# Patient Record
Sex: Male | Born: 1990
Health system: Southern US, Community
[De-identification: ages and names within clinical notes are randomized; demographics above are authoritative.]

## PROBLEM LIST (undated history)

## (undated) ENCOUNTER — Emergency Department (HOSPITAL_COMMUNITY): Admission: EM | Payer: Self-pay | Source: Home / Self Care

## (undated) DIAGNOSIS — M419 Scoliosis, unspecified: Secondary | ICD-10-CM

## (undated) DIAGNOSIS — R51 Headache: Secondary | ICD-10-CM

## (undated) DIAGNOSIS — J329 Chronic sinusitis, unspecified: Secondary | ICD-10-CM

## (undated) DIAGNOSIS — T7840XA Allergy, unspecified, initial encounter: Secondary | ICD-10-CM

## (undated) DIAGNOSIS — R040 Epistaxis: Secondary | ICD-10-CM

## (undated) DIAGNOSIS — J339 Nasal polyp, unspecified: Secondary | ICD-10-CM

## (undated) DIAGNOSIS — R519 Headache, unspecified: Secondary | ICD-10-CM

## (undated) HISTORY — DX: Allergy, unspecified, initial encounter: T78.40XA

---
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
## 2008-09-23 ENCOUNTER — Emergency Department (HOSPITAL_COMMUNITY): Admission: EM | Admit: 2008-09-23 | Discharge: 2008-09-23 | Payer: Self-pay | Admitting: Emergency Medicine

## 2008-12-22 ENCOUNTER — Emergency Department (HOSPITAL_COMMUNITY): Admission: EM | Admit: 2008-12-22 | Discharge: 2008-12-22 | Payer: Self-pay | Admitting: Emergency Medicine

## 2009-01-07 ENCOUNTER — Emergency Department (HOSPITAL_COMMUNITY): Admission: EM | Admit: 2009-01-07 | Discharge: 2009-01-07 | Payer: Self-pay | Admitting: Emergency Medicine

## 2009-03-17 ENCOUNTER — Emergency Department (HOSPITAL_COMMUNITY): Admission: EM | Admit: 2009-03-17 | Discharge: 2009-03-17 | Payer: Self-pay | Admitting: Emergency Medicine

## 2009-05-09 ENCOUNTER — Emergency Department (HOSPITAL_COMMUNITY): Admission: EM | Admit: 2009-05-09 | Discharge: 2009-05-09 | Payer: Self-pay | Admitting: Emergency Medicine

## 2009-05-29 ENCOUNTER — Emergency Department (HOSPITAL_COMMUNITY): Admission: EM | Admit: 2009-05-29 | Discharge: 2009-05-29 | Payer: Self-pay | Admitting: Emergency Medicine

## 2009-06-11 ENCOUNTER — Emergency Department (HOSPITAL_COMMUNITY): Admission: EM | Admit: 2009-06-11 | Discharge: 2009-06-12 | Payer: Self-pay | Admitting: Emergency Medicine

## 2009-07-18 ENCOUNTER — Emergency Department (HOSPITAL_COMMUNITY): Admission: EM | Admit: 2009-07-18 | Discharge: 2009-07-18 | Payer: Self-pay | Admitting: Emergency Medicine

## 2009-08-04 ENCOUNTER — Emergency Department (HOSPITAL_COMMUNITY): Admission: EM | Admit: 2009-08-04 | Discharge: 2009-08-04 | Payer: Self-pay | Admitting: Emergency Medicine

## 2009-09-07 ENCOUNTER — Emergency Department (HOSPITAL_COMMUNITY): Admission: EM | Admit: 2009-09-07 | Discharge: 2009-09-07 | Payer: Self-pay | Admitting: Emergency Medicine

## 2009-10-07 ENCOUNTER — Emergency Department (HOSPITAL_COMMUNITY): Admission: EM | Admit: 2009-10-07 | Discharge: 2009-10-08 | Payer: Self-pay | Admitting: Emergency Medicine

## 2009-10-11 ENCOUNTER — Emergency Department (HOSPITAL_COMMUNITY): Admission: EM | Admit: 2009-10-11 | Discharge: 2009-10-11 | Payer: Self-pay | Admitting: Emergency Medicine

## 2009-11-11 ENCOUNTER — Emergency Department (HOSPITAL_COMMUNITY): Admission: EM | Admit: 2009-11-11 | Discharge: 2009-11-11 | Payer: Self-pay | Admitting: Emergency Medicine

## 2010-01-11 ENCOUNTER — Emergency Department (HOSPITAL_COMMUNITY): Admission: EM | Admit: 2010-01-11 | Discharge: 2010-01-12 | Payer: Self-pay | Admitting: Emergency Medicine

## 2010-08-22 LAB — POCT I-STAT, CHEM 8
BUN: 6 mg/dL (ref 6–23)
Calcium, Ion: 1.09 mmol/L — ABNORMAL LOW (ref 1.12–1.32)
Chloride: 104 mEq/L (ref 96–112)
Glucose, Bld: 98 mg/dL (ref 70–99)
Hemoglobin: 15.6 g/dL (ref 13.0–17.0)
Potassium: 3.5 mEq/L (ref 3.5–5.1)

## 2010-08-22 LAB — CBC
Hemoglobin: 14.3 g/dL (ref 13.0–17.0)
MCH: 31.2 pg (ref 26.0–34.0)
MCHC: 33.6 g/dL (ref 30.0–36.0)
Platelets: 219 10*3/uL (ref 150–400)
WBC: 9.6 10*3/uL (ref 4.0–10.5)

## 2010-08-22 LAB — DIFFERENTIAL
Basophils Relative: 1 % (ref 0–1)
Lymphocytes Relative: 26 % (ref 12–46)
Monocytes Relative: 6 % (ref 3–12)

## 2010-08-22 LAB — ETHANOL: Alcohol, Ethyl (B): 125 mg/dL — ABNORMAL HIGH (ref 0–10)

## 2010-08-26 LAB — POCT I-STAT 3, ART BLOOD GAS (G3+)
Acid-Base Excess: 4 mmol/L — ABNORMAL HIGH (ref 0.0–2.0)
Patient temperature: 98.7

## 2010-08-26 LAB — CULTURE, BLOOD (ROUTINE X 2): Culture: NO GROWTH

## 2010-08-26 LAB — COMPREHENSIVE METABOLIC PANEL
Alkaline Phosphatase: 54 U/L (ref 39–117)
BUN: 4 mg/dL — ABNORMAL LOW (ref 6–23)
Calcium: 8.7 mg/dL (ref 8.4–10.5)
Creatinine, Ser: 0.77 mg/dL (ref 0.4–1.5)
GFR calc non Af Amer: >60 mL/min (ref >60)
Glucose, Bld: 94 mg/dL (ref 70–99)
Potassium: 3.4 mEq/L — ABNORMAL LOW (ref 3.5–5.1)
Total Bilirubin: 0.7 mg/dL (ref 0.3–1.2)
Total Protein: 6.6 g/dL (ref 6.0–8.3)

## 2010-08-26 LAB — LACTIC ACID, PLASMA: Lactic Acid, Venous: 1 mmol/L (ref 0.5–2.2)

## 2010-08-26 LAB — URINALYSIS, ROUTINE W REFLEX MICROSCOPIC
Bilirubin Urine: NEGATIVE
Glucose, UA: NEGATIVE mg/dL
Specific Gravity, Urine: 1.013 (ref 1.005–1.030)
pH: 6.5 (ref 5.0–8.0)

## 2010-08-26 LAB — CBC
MCHC: 34.5 g/dL (ref 30.0–36.0)
RDW: 14.4 % (ref 11.5–15.5)

## 2010-08-26 LAB — RAPID URINE DRUG SCREEN, HOSP PERFORMED
Amphetamines: NOT DETECTED
Tetrahydrocannabinol: NOT DETECTED

## 2010-08-26 LAB — ACETAMINOPHEN LEVEL: Acetaminophen (Tylenol), Serum: 29.6 ug/mL (ref 10–30)

## 2010-08-26 LAB — DIFFERENTIAL: Basophils Absolute: 0.1 10*3/uL (ref 0.0–0.1)

## 2010-11-26 ENCOUNTER — Emergency Department (HOSPITAL_COMMUNITY): Payer: Self-pay

## 2010-11-26 ENCOUNTER — Emergency Department (HOSPITAL_COMMUNITY)
Admission: EM | Admit: 2010-11-26 | Discharge: 2010-11-27 | Disposition: A | Discharge status: Home / Self Care | Payer: Self-pay | Attending: Emergency Medicine | Admitting: Emergency Medicine

## 2010-11-26 DIAGNOSIS — R197 Diarrhea, unspecified: Secondary | ICD-10-CM | POA: Insufficient documentation

## 2010-11-26 DIAGNOSIS — R1031 Right lower quadrant pain: Secondary | ICD-10-CM | POA: Insufficient documentation

## 2010-11-26 DIAGNOSIS — R112 Nausea with vomiting, unspecified: Secondary | ICD-10-CM | POA: Insufficient documentation

## 2010-11-26 LAB — URINALYSIS, ROUTINE W REFLEX MICROSCOPIC
Bilirubin Urine: NEGATIVE
Hgb urine dipstick: NEGATIVE
Nitrite: NEGATIVE
Protein, ur: NEGATIVE mg/dL
Specific Gravity, Urine: 1.031 — ABNORMAL HIGH (ref 1.005–1.030)
Urobilinogen, UA: 1 mg/dL (ref 0.0–1.0)

## 2010-11-26 LAB — CBC
MCHC: 34 g/dL (ref 30.0–36.0)
MCV: 89.8 fL (ref 78.0–100.0)
Platelets: 222 10*3/uL (ref 150–400)
RDW: 13.4 % (ref 11.5–15.5)
WBC: 11.8 10*3/uL — ABNORMAL HIGH (ref 4.0–10.5)

## 2010-11-26 LAB — DIFFERENTIAL
Basophils Absolute: 0.1 10*3/uL (ref 0.0–0.1)
Eosinophils Absolute: 0.6 10*3/uL (ref 0.0–0.7)
Eosinophils Relative: 5 % (ref 0–5)
Monocytes Absolute: 0.9 10*3/uL (ref 0.1–1.0)

## 2010-11-26 LAB — POCT I-STAT, CHEM 8
Calcium, Ion: 1.23 mmol/L (ref 1.12–1.32)
Creatinine, Ser: 1 mg/dL (ref 0.50–1.35)
Glucose, Bld: 107 mg/dL — ABNORMAL HIGH (ref 70–99)
HCT: 48 % (ref 39.0–52.0)
Hemoglobin: 16.3 g/dL (ref 13.0–17.0)

## 2010-11-27 MED ORDER — IOHEXOL 300 MG/ML  SOLN
100.0000 mL | Freq: Once | INTRAMUSCULAR | Status: AC | PRN
  Administered 2010-11-27: 100 mL via INTRAVENOUS

## 2011-01-09 IMAGING — CR DG ELBOW COMPLETE 3+V*L*
4 series · 4 of 4 positions shown · non-contrast
Comparison: None.

CLINICAL DATA: Wall, altered mental status

LEFT ELBOW - COMPLETE 3+ VIEW

[x elbow joint ap left]
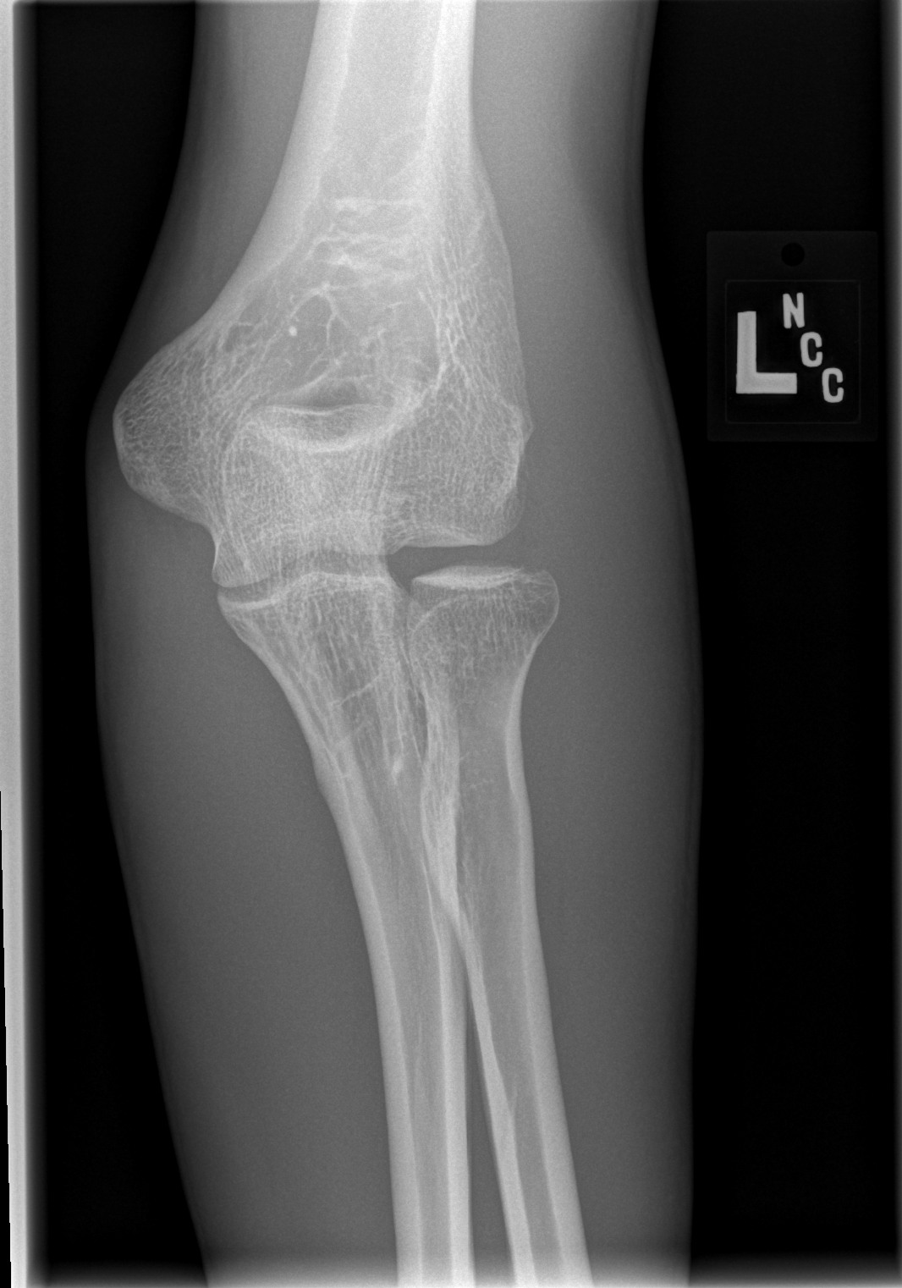

[x elbow joint obl. left (1 of 2)]
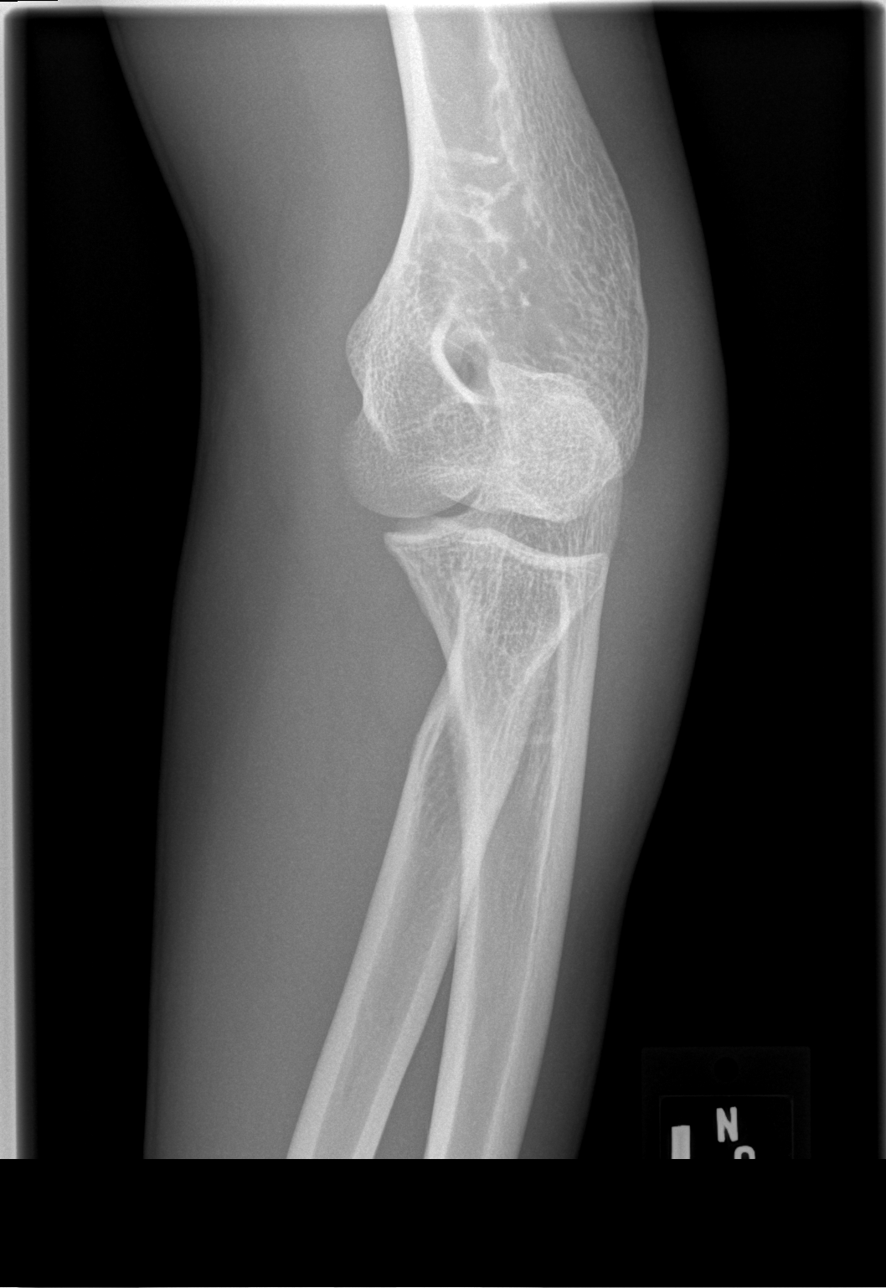

[x elbow joint obl. left (2 of 2)]
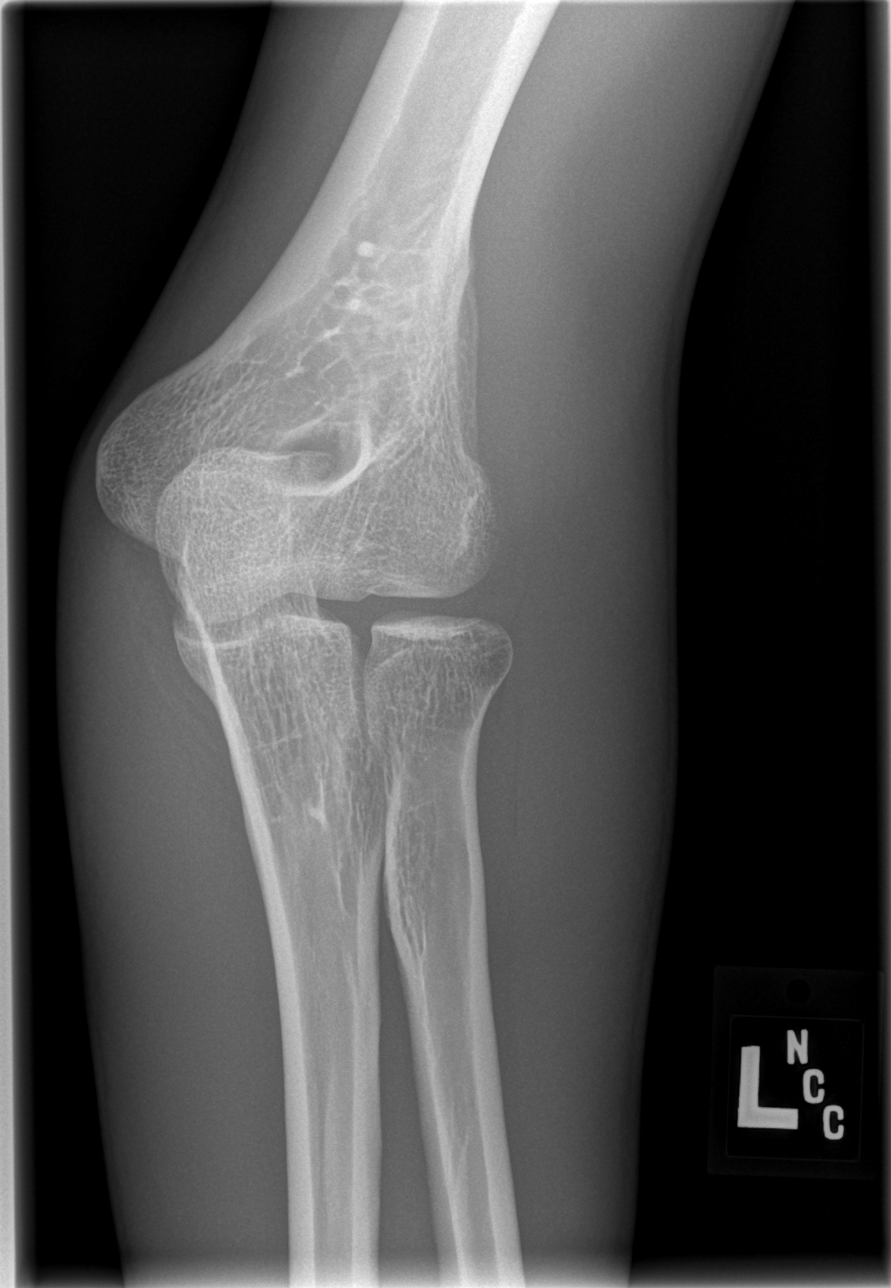

[x elbow joint lat left]
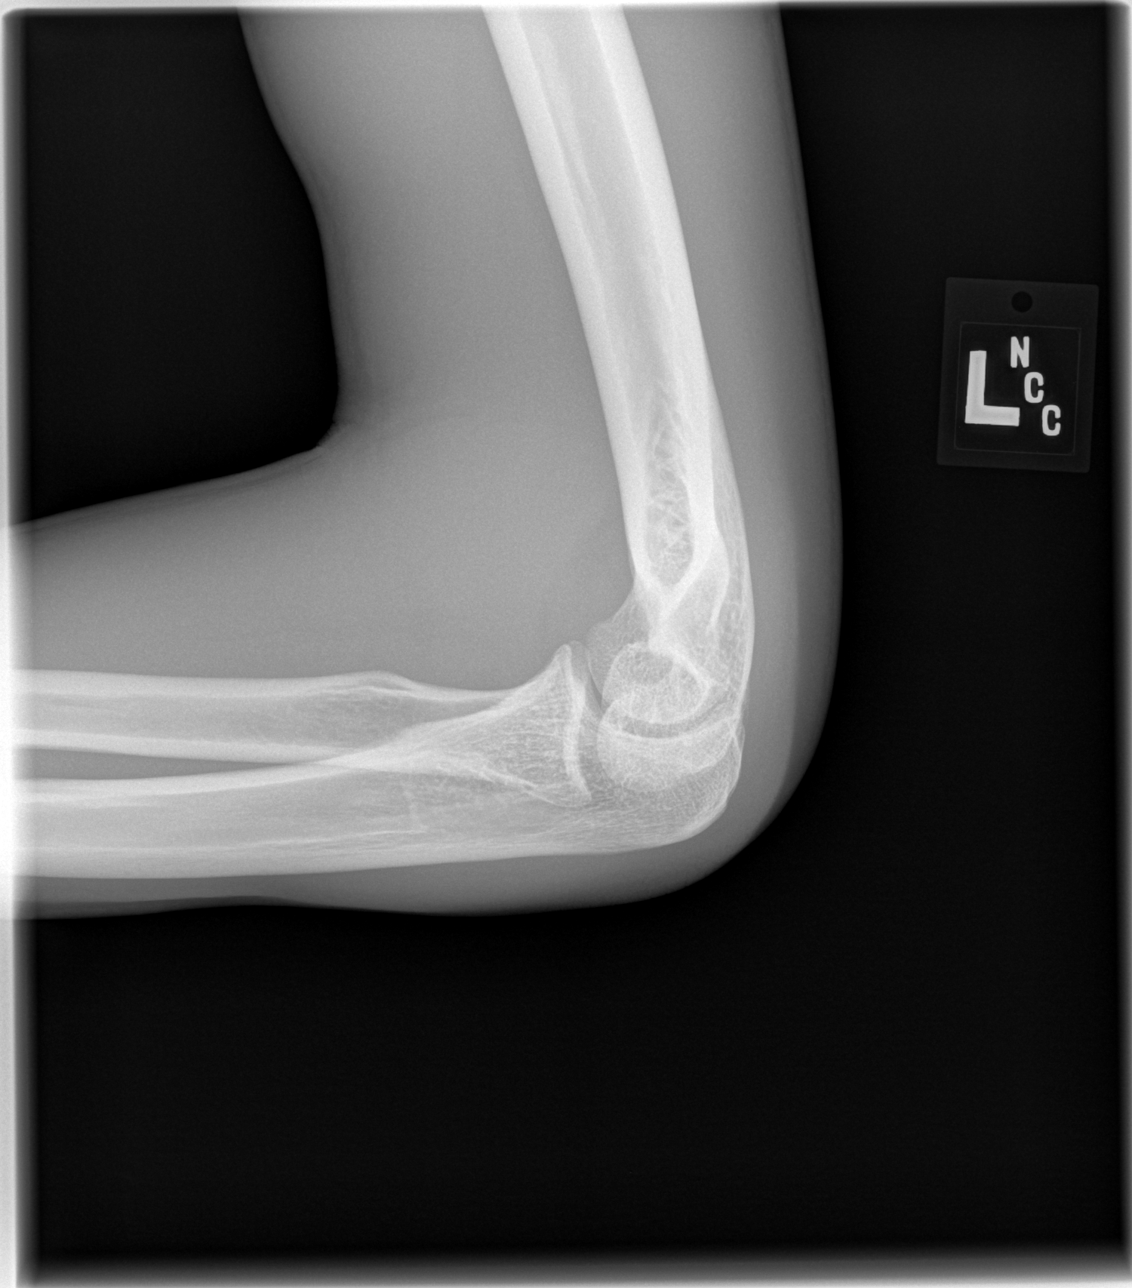

[4 of 4 positions shown; findings below may reference images not displayed]

FINDINGS: There is a left oblique joint space effusion present with
displacement of anterior and posterior fat pads.  There is slight
irregularity of the radial head, and I suspect a subtle left radial
head fracture.  Alignment is normal.
IMPRESSION: Left elbow joint space effusion.  Suspect subtle left radial head
fracture.

## 2011-01-09 IMAGING — CT CT HEAD W/O CM
5 of 6 series · 17 of 37 positions shown, 19 images · non-contrast
Comparison: None.

CT HEAD

CLINICAL DATA: Fall down steps.  Altered level of consciousness.
The patient is amnestic to the event.

CT HEAD WITHOUT CONTRAST
CT CERVICAL SPINE WITHOUT CONTRAST
TECHNIQUE: Multidetector CT imaging of the head and cervical spine
was performed following the standard protocol without intravenous
contrast.  Multiplanar CT image reconstructions of the cervical
spine were also generated.

[Series 2: brain · axial · 0.49mm/px · z∈[+295,+377]mm · 3 of 32 slices shown]
[im 8/32  brain]
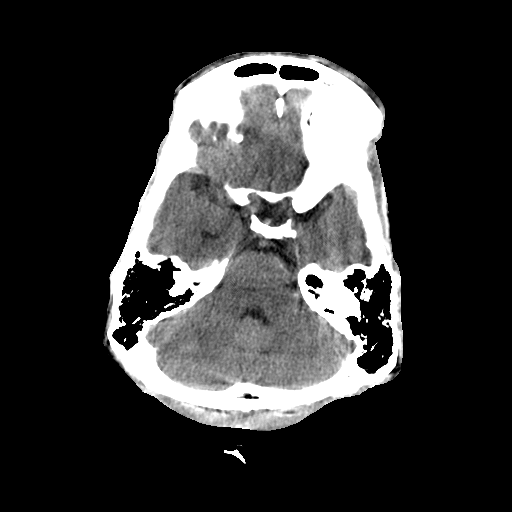
[im 16/32  brain]
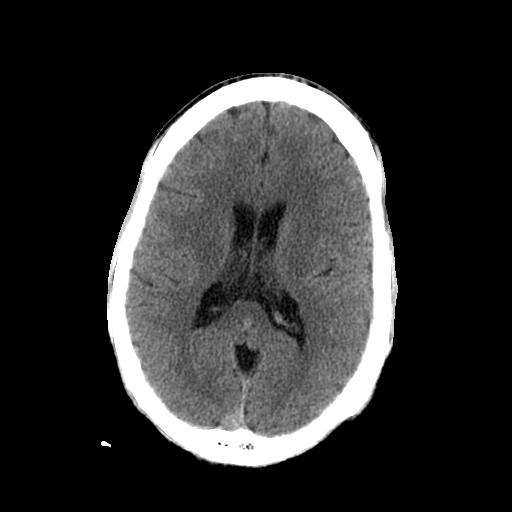
[im 24/32  brain]
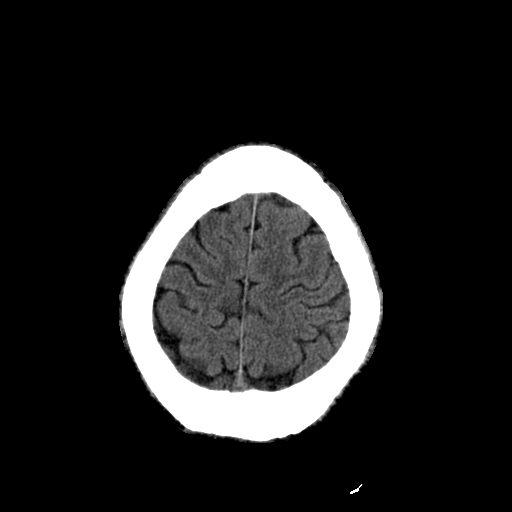

[Series 3: recon 2: brain · axial · 0.49mm/px · z∈[+284,+391]mm · 5 of 64 slices shown, 7 images]
[im 11/64  brain]
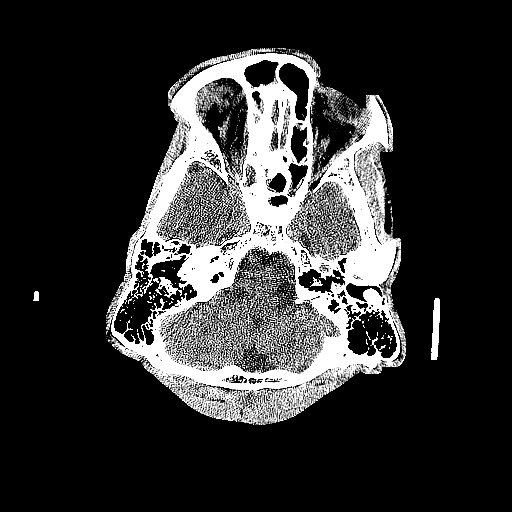
[im 11/64  bone]
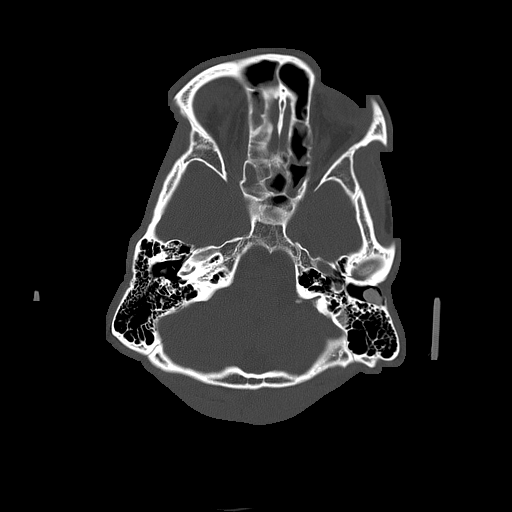
[im 22/64  brain]
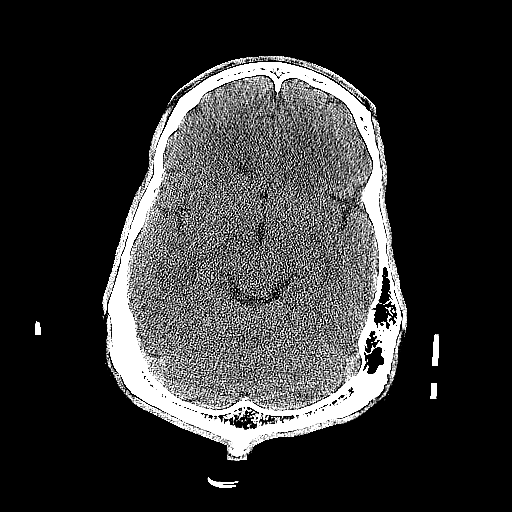
[im 32/64  brain]
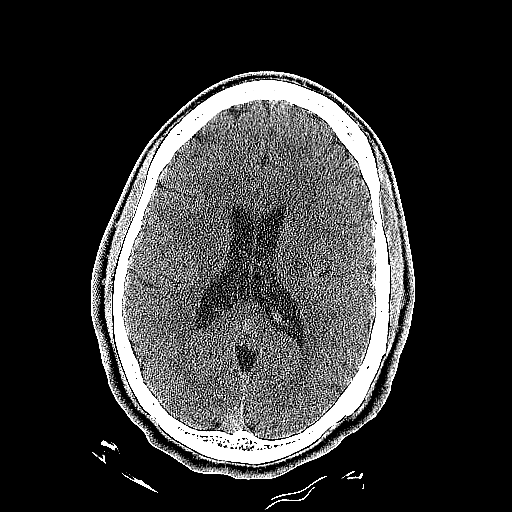
[im 43/64  brain]
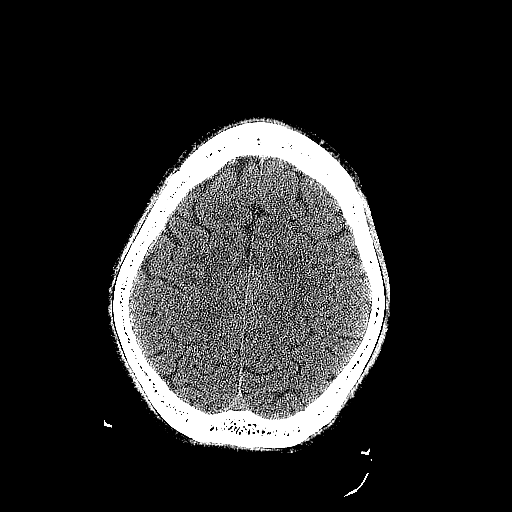
[im 53/64  brain]
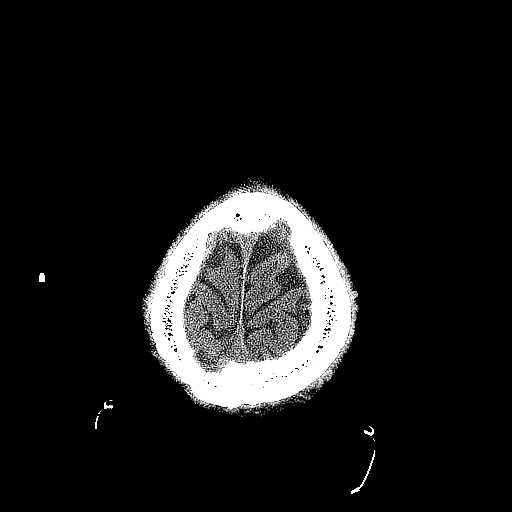
[im 53/64  bone]
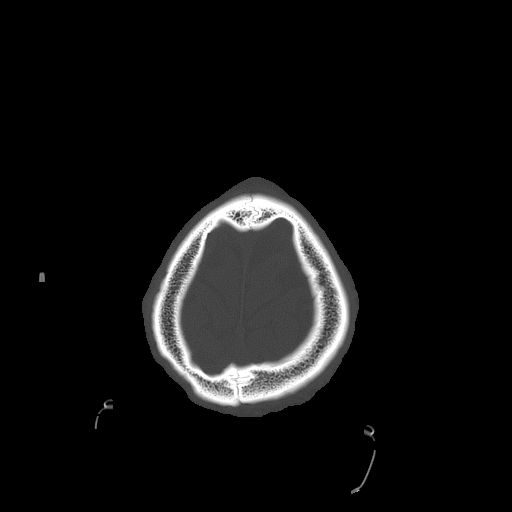

[Series 4: cervical spine · axial · 0.29mm/px · z∈[+72,+147]mm · 4 of 81 slices shown]
[im 11/81  brain]
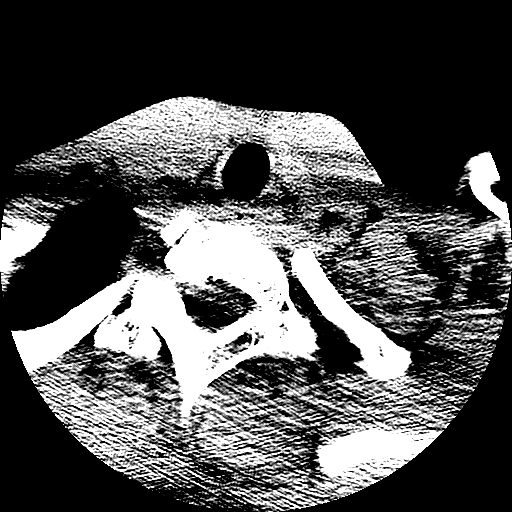
[im 21/81  brain]
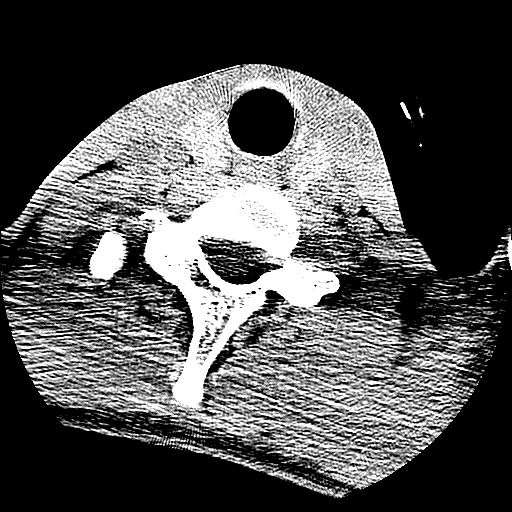
[im 31/81  brain]
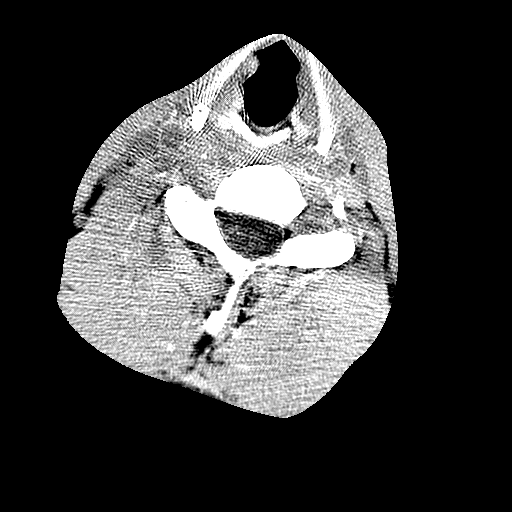
[im 41/81  brain]
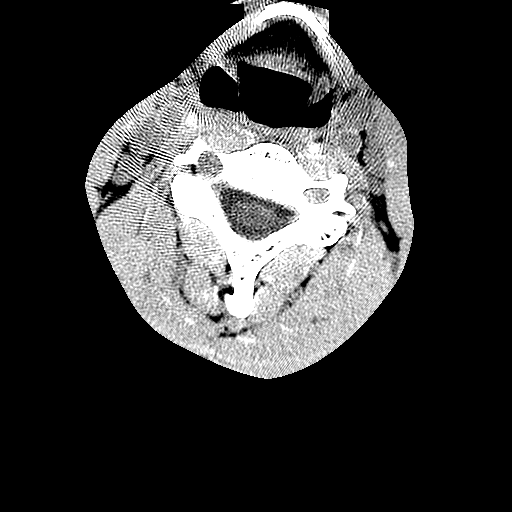

[Series 600: reformatted · sagittal · 0.40mm/px · 2 of 44 slices shown (1 of 2)]
[im 15/44  brain]
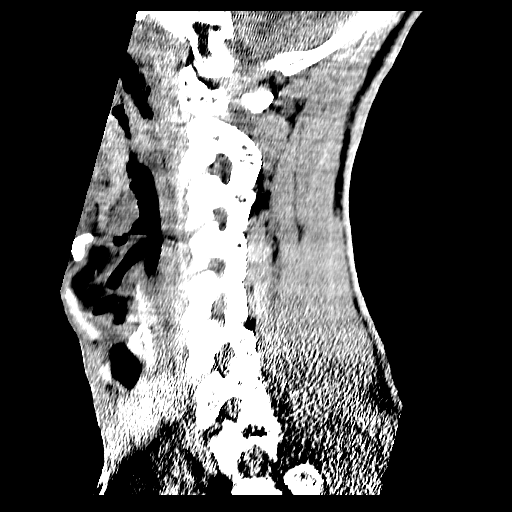
[im 29/44  brain]
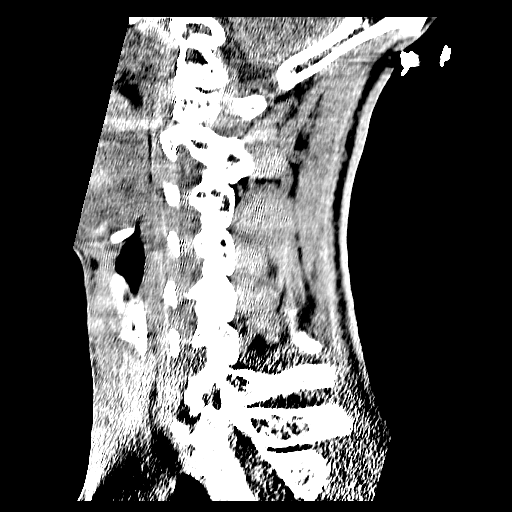

[Series 601: reformatted · coronal · 0.40mm/px · 3 of 44 slices shown (2 of 2)]
[im 2/44  brain]
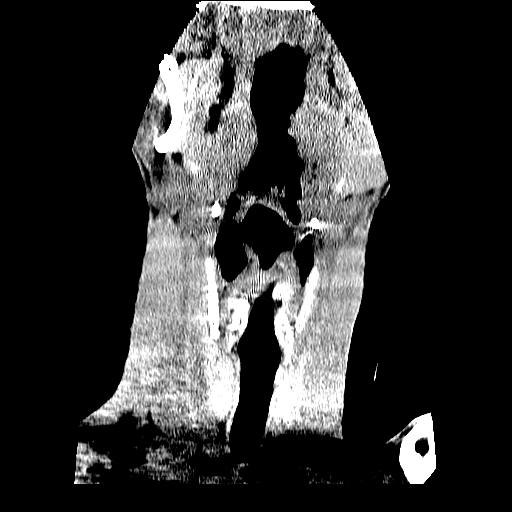
[im 16/44  brain]
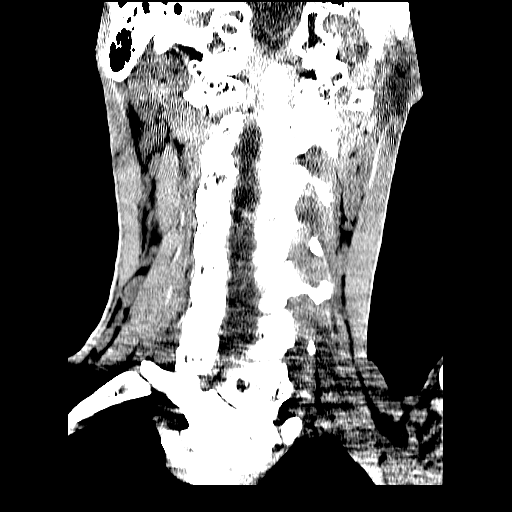
[im 30/44  brain]
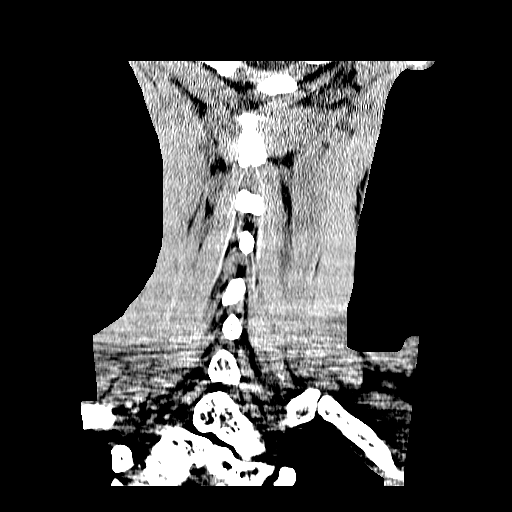

[17 of 37 positions shown; findings below may reference images not displayed]

FINDINGS: No acute cortical infarct, hemorrhage, mass,
hydrocephalus, or significant extra-axial fluid collection is
present.  There is extensive opacification of the ethmoid air cells
bilaterally, worse on the right.  The right sphenoid sinus and
sphenoidal recess are near totally opacified.  There is
circumferential mucosal thickening in the frontal sinuses
bilaterally, worse on the right.  The mastoid air cells are clear.
Debris in the external auditory canals bilaterally likely
represents cerumen.
IMPRESSION: 1.  No acute intracranial abnormality.
2.  Extensive sinus disease.

CT CERVICAL SPINE
FINDINGS: The cervical spine is visualized from skull base through
T1-2.  The vertebral body heights and alignment are maintained.  No
acute fracture or traumatic subluxation is evident.  The soft
tissues are unremarkable.  The lung apices are clear.
IMPRESSION: Negative CT of the cervical spine.

## 2011-02-27 LAB — INFLUENZA A+B VIRUS AG-DIRECT(RAPID): Inflenza A Ag: NEGATIVE

## 2011-09-18 ENCOUNTER — Emergency Department (HOSPITAL_COMMUNITY): Payer: Self-pay

## 2011-09-18 ENCOUNTER — Emergency Department (HOSPITAL_COMMUNITY)
Admission: EM | Admit: 2011-09-18 | Discharge: 2011-09-18 | Disposition: A | Discharge status: Home / Self Care | Payer: Self-pay | Attending: Emergency Medicine | Admitting: Emergency Medicine

## 2011-09-18 ENCOUNTER — Encounter (HOSPITAL_COMMUNITY): Payer: Self-pay | Admitting: *Deleted

## 2011-09-18 DIAGNOSIS — R05 Cough: Secondary | ICD-10-CM | POA: Insufficient documentation

## 2011-09-18 DIAGNOSIS — J3489 Other specified disorders of nose and nasal sinuses: Secondary | ICD-10-CM | POA: Insufficient documentation

## 2011-09-18 DIAGNOSIS — R0602 Shortness of breath: Secondary | ICD-10-CM | POA: Insufficient documentation

## 2011-09-18 DIAGNOSIS — R599 Enlarged lymph nodes, unspecified: Secondary | ICD-10-CM | POA: Insufficient documentation

## 2011-09-18 DIAGNOSIS — H6120 Impacted cerumen, unspecified ear: Secondary | ICD-10-CM | POA: Insufficient documentation

## 2011-09-18 DIAGNOSIS — J40 Bronchitis, not specified as acute or chronic: Secondary | ICD-10-CM | POA: Insufficient documentation

## 2011-09-18 DIAGNOSIS — R0789 Other chest pain: Secondary | ICD-10-CM | POA: Insufficient documentation

## 2011-09-18 DIAGNOSIS — R059 Cough, unspecified: Secondary | ICD-10-CM | POA: Insufficient documentation

## 2011-09-18 DIAGNOSIS — IMO0001 Reserved for inherently not codable concepts without codable children: Secondary | ICD-10-CM | POA: Insufficient documentation

## 2011-09-18 DIAGNOSIS — R062 Wheezing: Secondary | ICD-10-CM | POA: Insufficient documentation

## 2011-09-18 DIAGNOSIS — M2550 Pain in unspecified joint: Secondary | ICD-10-CM | POA: Insufficient documentation

## 2011-09-18 MED ORDER — ALBUTEROL SULFATE HFA 108 (90 BASE) MCG/ACT IN AERS
1.0000 | INHALATION_SPRAY | Freq: Once | RESPIRATORY_TRACT | Status: AC
  Administered 2011-09-18: 1 via RESPIRATORY_TRACT
  Filled 2011-09-18: qty 6.7

## 2011-09-18 MED ORDER — ALBUTEROL SULFATE (5 MG/ML) 0.5% IN NEBU
5.0000 mg | INHALATION_SOLUTION | Freq: Once | RESPIRATORY_TRACT | Status: AC
  Administered 2011-09-18: 5 mg via RESPIRATORY_TRACT
  Filled 2011-09-18: qty 1

## 2011-09-18 MED ORDER — FLUTICASONE PROPIONATE 50 MCG/ACT NA SUSP: NASAL | Status: DC

## 2011-09-18 MED ORDER — IBUPROFEN 800 MG PO TABS
800.0000 mg | ORAL_TABLET | Freq: Once | ORAL | Status: AC
  Administered 2011-09-18: 800 mg via ORAL
  Filled 2011-09-18: qty 1

## 2011-09-18 MED ORDER — IPRATROPIUM BROMIDE 0.02 % IN SOLN
0.5000 mg | Freq: Once | RESPIRATORY_TRACT | Status: AC
  Administered 2011-09-18: 0.5 mg via RESPIRATORY_TRACT
  Filled 2011-09-18: qty 2.5

## 2011-09-18 NOTE — ED Notes (Signed)
Pt reports 2-3 day hx of cough, sob, and night sweats. Unsure of fevers.

## 2011-09-18 NOTE — ED Provider Notes (Signed)
History     CSN: [id number]  Arrival date & time 09/18/11  1246   First MD Initiated Contact with Patient 09/18/11 1505      Chief Complaint  Patient presents with  . Cough  . Shortness of Breath  . Generalized Body Aches    (Consider location/radiation/quality/duration/timing/severity/associated sxs/prior treatment) HPI History provided by pt.   Pt has had a cough productive of yellowish-green sputum for the past three days.  Associated w/ tightness mid-line upper chest, SOB that is aggravated by exertion, a small amount of wheezing and arthralgias.  Has nasal congestion but this is chronic x approx 2 years.  Denies fever, sore throat, abd pain and vomiting, but has had diarrhea.  Pt has no h/o asthma or other cardiopulmonary disease.  Smokes cigarettes.   History reviewed. No pertinent past medical history.  History reviewed. No pertinent past surgical history.  History reviewed. No pertinent family history.  History  Substance Use Topics  . Smoking status: Current Everyday Smoker  . Smokeless tobacco: Not on file  . Alcohol Use: Yes     occ      Review of Systems  All other systems reviewed and are negative.    Allergies  Review of patient's allergies indicates no known allergies.  Home Medications  No current outpatient prescriptions on file.  BP 127/86  Pulse 106  Temp(Src) 98.6 F (37 C) (Oral)  Resp 16  SpO2 96%  Physical Exam  Nursing note and vitals reviewed. Constitutional: He is oriented to person, place, and time. He appears well-developed and well-nourished. No distress.  HENT:  Head: No trismus in the jaw.  Right Ear: Tympanic membrane, external ear and ear canal normal.  Left Ear: Tympanic membrane, external ear and ear canal normal.  Mouth/Throat: Uvula is midline, oropharynx is clear and moist and mucous membranes are normal. No oropharyngeal exudate, posterior oropharyngeal edema or posterior oropharyngeal erythema.       Impacted  cerumen bilaterally.  Nasal congestion.  Eyes:       Normal appearance  Neck: Normal range of motion. Neck supple.  Cardiovascular: Normal rate and regular rhythm.   Pulmonary/Chest: Effort normal.       Diffuse, mild exp wheezing.  Coughing  Lymphadenopathy:    He has cervical adenopathy.  Neurological: He is alert and oriented to person, place, and time.  Skin: Skin is warm and dry. No rash noted.  Psychiatric: He has a normal mood and affect. His behavior is normal.    ED Course  Procedures (including critical care time)  Labs Reviewed - No data to display Dg Chest 2 View  09/18/2011  *RADIOLOGY REPORT*  Clinical Data: Cough, shortness of breath.  CHEST - 2 VIEW  Comparison: 11/11/2009  Findings: Heart and mediastinal contours are within normal limits. No focal opacities or effusions.  No acute bony abnormality. Severe upper thoracic rightward scoliosis, stable.  IMPRESSION: No active cardiopulmonary disease.  Original Report Authenticated By: Cyndie Chime, M.D.     1. Bronchitis       MDM  Healthy 21yo M presents w/ productive cough, chest tightness, SOB and wheezing.  Diffuse expiratory wheezing on exam; no fever or respiratory distress.  CXR neg for pneumonia.  Will treat symptomatically for bronchitis.  Pt receiving a breathing treatment now.  Will reassess shortly and if wheezing improved, d/c home w/ an albuterol inhaler.  I recommended ibuprofen for pain, sudafed for nasal congestion and will prescribe him fluticasone as well since congestion is  chronic.  Will refer to [facility] as well.    Breath sounds have coarsened and pt no longer coughing on exam.  Pt reports feeling much better.  D/c'd home.        Arie Sabina Pitkin, [place] 09/18/11 [phone]

## 2011-09-18 NOTE — ED Provider Notes (Signed)
Medical screening examination/treatment/procedure(s) were performed by non-physician practitioner and as supervising physician I was immediately available for consultation/collaboration.   Omar Gayden L Gem Conkle, MD 09/18/11 1808 

## 2011-09-18 NOTE — Discharge Instructions (Signed)
Use albuterol inhaler, 2 puffs every 4 hours, as needed for cough and shortness of breath.  Take sudafed as needed for nasal congestion and try the daily fluticasone spray as well.  Take ibuprofen, up to 800mg  three times a day, or tylenol as needed for fever and body aches.  Call [facility] [phone]) if you do not have a primary care doctor and would like assistance with finding one.   You should return to the ER if your chest pain or shortness of breath worsens. Bronchitis Bronchitis is the body's way of reacting to injury and/or infection (inflammation) of the bronchi. Bronchi are the air tubes that extend from the windpipe into the lungs. If the inflammation becomes severe, it may cause shortness of breath. CAUSES  Inflammation may be caused by:  A virus.   Germs (bacteria).   Dust.   Allergens.   Pollutants and many other irritants.  The cells lining the bronchial tree are covered with tiny hairs (cilia). These constantly beat upward, away from the lungs, toward the mouth. This keeps the lungs free of pollutants. When these cells become too irritated and are unable to do their job, mucus begins to develop. This causes the characteristic cough of bronchitis. The cough clears the lungs when the cilia are unable to do their job. Without either of these protective mechanisms, the mucus would settle in the lungs. Then you would develop pneumonia. Smoking is a common cause of bronchitis and can contribute to pneumonia. Stopping this habit is the single most important thing you can do to help yourself. TREATMENT   Your caregiver may prescribe an antibiotic if the cough is caused by bacteria. Also, medicines that open up your airways make it easier to breathe. Your caregiver may also recommend or prescribe an expectorant. It will loosen the mucus to be coughed up. Only take over-the-counter or prescription medicines for pain, discomfort, or fever as directed by your caregiver.   Removing  whatever causes the problem (smoking, for example) is critical to preventing the problem from getting worse.   Cough suppressants may be prescribed for relief of cough symptoms.   Inhaled medicines may be prescribed to help with symptoms now and to help prevent problems from returning.   For those with recurrent (chronic) bronchitis, there may be a need for steroid medicines.  SEEK IMMEDIATE MEDICAL CARE IF:   During treatment, you develop more pus-like mucus (purulent sputum).   You have a fever.   Your baby is older than 3 months with a rectal temperature of 102 F (38.9 C) or higher.   Your baby is 31 months old or younger with a rectal temperature of 100.4 F (38 C) or higher.   You become progressively more ill.   You have increased difficulty breathing, wheezing, or shortness of breath.  It is necessary to seek immediate medical care if you are elderly or sick from any other disease. MAKE SURE YOU:   Understand these instructions.   Will watch your condition.   Will get help right away if you are not doing well or get worse.  Document Released: 05/25/2005 Document Revised: 05/14/2011 Document Reviewed: 04/03/2008 [facility] Patient Information 2012 [place], [place].

## 2011-09-18 NOTE — ED Notes (Signed)
Pt reports onset symptoms 2-3 days ago. Cough, fever and cold sweats at night. States no hx of asthma but is a smoker. States has not smoked while he has been sick. Also complains of body aches.

## 2011-09-25 ENCOUNTER — Emergency Department (HOSPITAL_COMMUNITY)
Admission: EM | Admit: 2011-09-25 | Discharge: 2011-09-25 | Disposition: A | Discharge status: Home / Self Care | Payer: Self-pay | Attending: Emergency Medicine | Admitting: Emergency Medicine

## 2011-09-25 ENCOUNTER — Encounter (HOSPITAL_COMMUNITY): Payer: Self-pay | Admitting: General Practice

## 2011-09-25 DIAGNOSIS — F17200 Nicotine dependence, unspecified, uncomplicated: Secondary | ICD-10-CM | POA: Insufficient documentation

## 2011-09-25 DIAGNOSIS — Z202 Contact with and (suspected) exposure to infections with a predominantly sexual mode of transmission: Secondary | ICD-10-CM | POA: Insufficient documentation

## 2011-09-25 LAB — URINALYSIS, ROUTINE W REFLEX MICROSCOPIC
Glucose, UA: NEGATIVE mg/dL
Ketones, ur: NEGATIVE mg/dL
Leukocytes, UA: NEGATIVE
Nitrite: NEGATIVE
Specific Gravity, Urine: 1.02 (ref 1.005–1.030)
pH: 6.5 (ref 5.0–8.0)

## 2011-09-25 MED ORDER — AZITHROMYCIN 1 G PO PACK
1.0000 g | PACK | Freq: Once | ORAL | Status: AC
  Administered 2011-09-25: 1 g via ORAL
  Filled 2011-09-25: qty 1

## 2011-09-25 MED ORDER — CEFTRIAXONE SODIUM 250 MG IJ SOLR
250.0000 mg | Freq: Once | INTRAMUSCULAR | Status: AC
  Administered 2011-09-25: 250 mg via INTRAMUSCULAR
  Filled 2011-09-25: qty 250

## 2011-09-25 NOTE — ED Provider Notes (Signed)
History     CSN: [id number]  Arrival date & time 09/25/11  1220   First MD Initiated Contact with Patient 09/25/11 1252      Chief Complaint  Patient presents with  . SEXUALLY TRANSMITTED DISEASE     HPI Pt was seen at 1305.  Per pt, c/o gradual onset and persistence of constant STD exposure for an unk period of time.  Pt states he had unprotected sex with a partner who called him and stated they "had chlamydia."  Pt states he is here to "get checked out" and "get treated."  Denies abd pain, no back pain, no fevers, no dysuria, no rash, no testicular pain/swelling, no penile drainage/rash.      Past Medical History  Diagnosis Date  . Bronchitis     History reviewed. No pertinent past surgical history.  History  Substance Use Topics  . Smoking status: Current Everyday Smoker -- 0.2 packs/day    Types: Cigarettes  . Smokeless tobacco: Never Used  . Alcohol Use: Yes     occ    Review of Systems ROS: Statement: All systems negative except as marked or noted in the HPI; Constitutional: Negative for fever and chills. ; ; Eyes: Negative for eye pain, redness and discharge. ; ; ENMT: Negative for ear pain, hoarseness, nasal congestion, sinus pressure and sore throat. ; ; Cardiovascular: Negative for chest pain, palpitations, diaphoresis, dyspnea and peripheral edema. ; ; Respiratory: Negative for cough, wheezing and stridor. ; ; Gastrointestinal: Negative for nausea, vomiting, diarrhea, abdominal pain, blood in stool, hematemesis, jaundice and rectal bleeding. . ; ; Genitourinary: Negative for dysuria, flank pain and hematuria.; Genital:  No penile drainage or rash, no testicular pain or swelling, no scrotal rash or swelling. ; Musculoskeletal: Negative for back pain and neck pain. Negative for swelling and trauma.; ; Skin: Negative for pruritus, rash, abrasions, blisters, bruising and skin lesion.; ; Neuro: Negative for headache, lightheadedness and neck stiffness. Negative for weakness,  altered level of consciousness , altered mental status, extremity weakness, paresthesias, involuntary movement, seizure and syncope.     Allergies  Review of patient's allergies indicates no known allergies.  Home Medications  No current outpatient prescriptions on file.  BP 112/83  Pulse 108  Temp(Src) 98.5 F (36.9 C) (Oral)  Resp 16  SpO2 100%  Physical Exam 1310: Physical examination:  Nursing notes reviewed; Vital signs and O2 SAT reviewed;  Constitutional: Well developed, Well nourished, Well hydrated, In no acute distress; Head:  Normocephalic, atraumatic; Eyes: EOMI, PERRL, No scleral icterus; ENMT: Mouth and pharynx normal, Mucous membranes moist; Neck: Supple, Full range of motion, No lymphadenopathy; Cardiovascular: Regular rate and rhythm, No murmur, rub, or gallop; Respiratory: Breath sounds clear & equal bilaterally, No rales, rhonchi, wheezes, or rub, Normal respiratory effort/excursion; Chest: Nontender, Movement normal; Abdomen: Soft, Nontender, Nondistended, Normal bowel sounds; Genitourinary: Genital performed with pt permission and male ED RN chaparone present during exam.  No perineal erythema.  No penile lesions or drainage.  No scrotal erythema, edema or tenderness to palp.  Normal testicular lie.  No testicular tenderness to palp.  +cremasteric reflexes bilat.  No inguinal LAN or palpable masses.  GC/chlamydia obtained and sent to lab; Extremities: Pulses normal, No tenderness, No edema, No calf edema or asymmetry.; Neuro: AA&Ox3, Major CN grossly intact.  No gross focal motor or sensory deficits in extremities.; Skin: Color normal, Warm, Dry, no rash.    ED Course  Procedures  MDM  MDM Reviewed: nursing note and vitals  Interpretation: labs   Results for orders placed during the hospital encounter of 09/25/11  URINALYSIS, ROUTINE W REFLEX MICROSCOPIC      Component Value Range   Color, Urine YELLOW  YELLOW    APPearance CLEAR  CLEAR    Specific Gravity,  Urine 1.020  1.005 - 1.030    pH 6.5  5.0 - 8.0    Glucose, UA NEGATIVE  NEGATIVE (mg/dL)   Hgb urine dipstick NEGATIVE  NEGATIVE    Bilirubin Urine NEGATIVE  NEGATIVE    Ketones, ur NEGATIVE  NEGATIVE (mg/dL)   Protein, ur NEGATIVE  NEGATIVE (mg/dL)   Urobilinogen, UA 1.0  0.0 - 1.0 (mg/dL)   Nitrite NEGATIVE  NEGATIVE    Leukocytes, UA NEGATIVE  NEGATIVE      2:18 PM:   GC/chlam obtained and sent to lab.  UA without UTI/UC pending.  Pt states he was exposed to "chlamydia;" will tx empirically.  Pt instructed to [facility] to [facility] to obtain further STD testing (herpes, HIV, etc), verb understanding.  Dx testing d/w pt.  Questions answered.  Verb understanding, agreeable to d/c home with outpt f/u.            Laray Anger, DO 09/25/11 2307

## 2011-09-25 NOTE — ED Notes (Signed)
Pt d/c home in NAD. No reaction noted after abx treatment. Pt voiced that he would follow up with the health dept.

## 2011-09-25 NOTE — ED Notes (Signed)
Pt states he would like to be tested for possible STDs. He admits to unprotected sex but denies any pain, discharge or presence of any sores. No pain when urinating or with ejaculation. Denies any changes in bowel or bladder habits. He does c/o unusual feeling in lower abdomen, he has a vague description, nausea or vomiting.

## 2011-09-25 NOTE — ED Notes (Signed)
Pt states "i just want to be tested for any type of STD." States he does have a sore/scratch on penis.

## 2011-09-25 NOTE — Discharge Instructions (Signed)
RESOURCE GUIDE  Dental Problems  Patients with Medicaid: [facility]                     [facility] [address]                                           [address] Phone:  [phone]                                                  Phone:  [phone]  If unable to pay or uninsured, contact:  [facility] or [facility] to become qualified for the adult dental clinic.  Chronic Pain Problems Contact [facility]  [phone] Patients need to be referred by their primary care doctor.  Insufficient Money for Medicine Contact [employer]:  call "211" or [facility] 271-5999.  No Primary Care Doctor Call [facility]  832-8000 Other agencies that provide inexpensive medical care    [facility]  [phone]    [facility]  [phone]    [facility]  [phone]    Women's Clinic  [phone]    [facility]  [phone]    [facility]  [phone]  Psychological Services [facility]  [phone] [facility]  [phone] [facility]   [phone] (emergency services 641-4993)  Substance Abuse Resources [facility]  336-882-2125 [facility] 336-784-9470 [facility] 336-285-9073 [facility] 336-845-3988 Residential & Outpatient Substance Abuse Program  800-659-3381  Abuse/Neglect [place] Child Abuse Hotline (336) 641-3795 [place] Child Abuse Hotline 800-378-5315 (After Hours)  Emergency Shelter [facility] (336) 271-5985  Maternity Homes [facility] (336) 275-9566 [facility] (704) 372-4663  MRSA Hotline #:   832-7006    [place] Resources  [facility] [place]     [employer]                          [facility] [address] [place]                       [address]      [address]  [place]                                                [place]                            [place] Phone:  [phone]                                   Phone:  [phone]                 Phone:  [phone]  [facility] Phone:  [phone]    [facility] Child Abuse Hotline (201)412-4568 7370381839 (After Hours)   Call your regular medical doctor today to schedule a follow up appointment within the next week for further STD testing.  Return to the Emergency Department immediately sooner if worsening.

## 2011-09-26 LAB — GC/CHLAMYDIA PROBE AMP, GENITAL: Chlamydia, DNA Probe: NEGATIVE

## 2011-09-26 LAB — URINE CULTURE
Colony Count: NO GROWTH
Culture  Setup Time: 201304191350

## 2013-12-27 ENCOUNTER — Encounter (HOSPITAL_COMMUNITY): Payer: Self-pay | Admitting: Emergency Medicine

## 2013-12-27 ENCOUNTER — Emergency Department (HOSPITAL_COMMUNITY)
Admission: EM | Admit: 2013-12-27 | Discharge: 2013-12-27 | Disposition: A | Discharge status: Home / Self Care | Payer: Self-pay | Attending: Emergency Medicine | Admitting: Emergency Medicine

## 2013-12-27 DIAGNOSIS — Z202 Contact with and (suspected) exposure to infections with a predominantly sexual mode of transmission: Secondary | ICD-10-CM | POA: Insufficient documentation

## 2013-12-27 DIAGNOSIS — Z8709 Personal history of other diseases of the respiratory system: Secondary | ICD-10-CM | POA: Insufficient documentation

## 2013-12-27 DIAGNOSIS — F17200 Nicotine dependence, unspecified, uncomplicated: Secondary | ICD-10-CM | POA: Insufficient documentation

## 2013-12-27 LAB — RPR

## 2013-12-27 LAB — HIV ANTIBODY (ROUTINE TESTING W REFLEX): HIV 1&2 Ab, 4th Generation: NONREACTIVE

## 2013-12-27 MED ORDER — AZITHROMYCIN 250 MG PO TABS
1000.0000 mg | ORAL_TABLET | Freq: Once | ORAL | Status: AC
  Administered 2013-12-27: 1000 mg via ORAL
  Filled 2013-12-27: qty 4

## 2013-12-27 MED ORDER — CEFTRIAXONE SODIUM 250 MG IJ SOLR
250.0000 mg | Freq: Once | INTRAMUSCULAR | Status: AC
  Administered 2013-12-27: 250 mg via INTRAMUSCULAR
  Filled 2013-12-27: qty 250

## 2013-12-27 NOTE — Discharge Instructions (Signed)
You have been treated in the emergency department for an infection, possibly sexually transmitted. Results of your gonorrhea and chlamydia tests are pending and you will be notified if they are positive. It is very important to practice safe sex and use condoms with every sexual encounter. If your results are positive you need to notify all sexual partners so they can be treated as well. The website [email or web address].net/ can be used to send anonymous text messages or emails to alert sexual contacts. Follow up with your doctor, or OBGYN in regards to today's visit.    Gonorrhea and Chlamydia SYMPTOMS  In females, symptoms may go unnoticed. Symptoms that are more noticeable can include:  Belly (abdominal) pain.  Painful intercourse.  Watery mucous-like discharge from the vagina.  Miscarriage.  Discomfort when urinating.  Inflammation of the rectum.  Abnormal gray-green frothy vaginal discharge  Vaginal itching and irritatio  Itching and irritation of the area outside the vagina.   Painful urination.  Bleeding after sexual intercourse.  In males, symptoms include:  Burning with urination.  Pain in the testicles.  Watery mucous-like discharge from the penis.  It can cause longstanding (chronic) pelvic pain after frequent infections.  TREATMENT  PID can cause women to not be able to have children (sterile) if left untreated or if half-treated.  It is important to finish ALL medications given to you.  This is a sexually transmitted infection. So you are also at risk for other sexually transmitted diseases, including HIV (AIDS), it is recommended that you get tested. HOME CARE INSTRUCTIONS  Warning: This infection is contagious. Do not have sex until treatment is completed. Follow up at your caregiver's office or the clinic to which you were referred. If your diagnosis (learning what is wrong) is confirmed by culture or some other method, your recent sexual contacts need treatment. Even if  they are symptom free or have a negative culture or evaluation, they should be treated.  PREVENTION  Women should use sanitary pads instead of tampons for vaginal discharge.  Wipe front to back after using the toilet and avoid douching.   Practice safe sex, use condoms, have only one sex partner and be sure your sex partner is not having sex with others.  Ask your caregiver to test you for chlamydia at your regular checkups or sooner if you are having symptoms.  Ask for further information if you are pregnant.  SEEK IMMEDIATE MEDICAL CARE IF:  You develop an oral temperature above 102 F (38.9 C), not controlled by medications or lasting more than 2 days.  You develop an increase in pain.  You develop any type of abnormal discharge.  You develop vaginal bleeding and it is not time for your period.  You develop painful intercourse.   Bacterial Vaginosis  Bacterial vaginosis (BV) is a vaginal infection where the normal balance of bacteria in the vagina is disrupted. This is not a sexually transmitted disease and your sexual partners do NOT need to be treated. CAUSES  The cause of BV is not fully understood. BV develops when there is an increase or imbalance of harmful bacteria.  Some activities or behaviors can upset the normal balance of bacteria in the vagina and put women at increased risk including:  Having a new sex partner or multiple sex partners.  Douching.  Using an intrauterine device (IUD) for contraception.  It is not clear what role sexual activity plays in the development of BV. However, women that have never had sexual intercourse are  rarely infected with BV.  Women do not get BV from toilet seats, bedding, swimming pools or from touching objects around them.   SYMPTOMS  Grey vaginal discharge.  A fish-like odor with discharge, especially after sexual intercourse.  Itching or burning of the vagina and vulva.  Burning or pain with urination.  Some women have no signs or  symptoms at all.   TREATMENT  Sometimes BV will clear up without treatment.  BV may be treated with antibiotics.  BV can recur after treatment. If this happens, a second round of antibiotics will often be prescribed.  HOME CARE INSTRUCTIONS  Finish all medication as directed by your caregiver.  Do not have sex until treatment is completed.  Do NOT drink any alcoholic beverages while being treated  with Metronidazole (Flagyl). This will cause a severe reaction inducing vomiting.  RESOURCE GUIDE  Dental Problems  Patients with Medicaid: [facility]                     [facility] [phone]                                           [phone] Phone:  [phone]                                                  Phone:  [phone]  If unable to pay or uninsured, contact:  [facility] or [facility] to become qualified for the adult dental clinic.  Chronic Pain Problems Contact [facility]  [phone] Patients need to be referred by their primary care doctor.  Insufficient Money for Medicine Contact [employer] Way:  call "211" or [facility] 450 [phone].  No Primary Care Doctor Call [facility]  [phone] Other agencies that provide inexpensive medical care    [facility]  [phone]    [facility]  [phone]    [facility]  [phone]    [facility]  [phone]    [facility]  [phone]    [facility]  [phone]  [facility]ological Services [facility]  [phone] [facility]  [phone] [facility]   [phone] (emergency services [phone])  Substance Abuse Resources [facility]  [phone] [facility] [phone] [facility] [phone] [facility] [phone] Residential & Outpatient Substance Abuse Program  [phone]  Abuse/Neglect [facility] Hotline  [phone] [facility] Hot[facility] [phone] (After Hours)  [facility] [phone]([phone]  [facility] [place] [facility] [phone] [facility] [phone]  MRSA Hotline #:   870-882-6653838-823-1449    [facility]  [facility]     [employer]                          [facility] [address] [place]                       [address]      [address]  [address]                                                Cristobal Goldmann Phone:  [phone]                                   Phone:  [phone]                 Phone:  [phone]  [facility] Phone:  [phone]  [facility] Child Abuse Hotline [phone] [phone] (After Hours)

## 2013-12-27 NOTE — ED Provider Notes (Signed)
CSN: [id number]     Arrival date & time 12/27/13  1131 History  This chart was scribed for non-physician practitioner, Clabe Seal, PA-C, working with Layla Maw Ward, DO by Charline Bills, ED Scribe. This patient was seen in room TR07C/TR07C and the patient's care was started at 12:47 PM.   Chief Complaint  Patient presents with  . Exposure to STD    The patient was advised that he needed to come to the ED and be evaluated for an STD.  His girlfriend is currently adnmitted here for a UTI and has Clhamydia.  The patient denies any symptoms other than lower abdominal pain.   HPI Comments: Juan Woodward is a 23 y.o. male who presents to the Emergency Department for exposure to STD. Patient reports his male partner tested positive for Chlamydia, currently admitted.  Denies history of STI, genital sores, lesions, penile discharge. NO PCP  The history is provided by the patient. No language interpreter was used.   Past Medical History  Diagnosis Date  . Bronchitis    History reviewed. No pertinent past surgical history. History reviewed. No pertinent family history. History  Substance Use Topics  . Smoking status: Current Every Day Smoker -- 0.20 packs/day    Types: Cigarettes  . Smokeless tobacco: Never Used  . Alcohol Use: Yes     Comment: occ    Review of Systems  Constitutional: Negative for fever and chills.  Gastrointestinal: Negative for nausea and vomiting.  Genitourinary: Negative for dysuria, hematuria, discharge, penile swelling, scrotal swelling, genital sores, penile pain and testicular pain.  All other systems reviewed and are negative.  Allergies  Review of patient's allergies indicates no known allergies.  Home Medications   Prior to Admission medications   Not on File   Triage Vitals: BP 118/84  Pulse 71  Temp(Src) 97.3 F (36.3 C) (Oral)  Resp 18  Ht 6\' 3"  (1.905 m)  Wt 170 lb (77.111 kg)  BMI 21.25 kg/m2  SpO2 98% Physical Exam  Nursing  note and vitals reviewed. Constitutional: He is oriented to person, place, and time. He appears well-developed and well-nourished.  Non-toxic appearance. He does not have a sickly appearance. He does not appear ill. No distress.  HENT:  Head: Normocephalic and atraumatic.  Eyes: Conjunctivae and EOM are normal.  Neck: Neck supple.  Pulmonary/Chest: Effort normal.  Genitourinary: Testes normal and penis normal. Right testis shows no mass and no tenderness. Left testis shows no mass and no tenderness. No penile erythema. No discharge found.  No obvious genital lesions. Chaperone present.   Musculoskeletal: Normal range of motion.  Neurological: He is alert and oriented to person, place, and time.  Skin: Skin is warm and dry. He is not diaphoretic.  Psychiatric: He has a normal mood and affect. His behavior is normal.   ED Course  Procedures (including critical care time) DIAGNOSTIC STUDIES: Oxygen Saturation is 98% on RA, normal by my interpretation.    COORDINATION OF CARE: 12:47 PM-Discussed treatment plan which includes GU exam and STD panel with pt at bedside and pt agreed to plan.   Labs Review Labs Reviewed  GC/CHLAMYDIA PROBE AMP  RPR  HIV ANTIBODY (ROUTINE TESTING)     MDM   Final diagnoses:  Exposure to sexually transmitted disease (STD)   Pt presents requesting STD check, reports current male partner with positive Chlamydia, currently admitted. Denies symptoms. STD panel ordered, will treat in ED. Condom use advised.  Meds given in ED:  Medications  azithromycin (ZITHROMAX) tablet 1,000 mg (1,000 mg Oral Given 12/27/13 1408)  cefTRIAXone (ROCEPHIN) injection 250 mg (250 mg Intramuscular Given 12/27/13 1408)    There are no discharge medications for this patient.  I personally performed the services described in this documentation, which was scribed in my presence. The recorded information has been reviewed and is accurate.    Clabe SealLauren M Veda Arrellano, PA-C 12/28/13  [phone]

## 2013-12-27 NOTE — ED Notes (Signed)
The patient was advised that he needed to come to the ED and be evaluated for an STD.  His girlfriend is currently adnmitted here for a UTI and has Clhamydia.  The patient denies any symptoms other than lower abdominal pain.

## 2013-12-28 LAB — GC/CHLAMYDIA PROBE AMP
CT PROBE, AMP APTIMA: NEGATIVE
GC PROBE AMP APTIMA: POSITIVE — AB

## 2013-12-28 NOTE — ED Provider Notes (Signed)
Medical screening examination/treatment/procedure(s) were performed by non-physician practitioner and as supervising physician I was immediately available for consultation/collaboration.   EKG Interpretation None        Kristen N Ward, DO 12/28/13 1100 

## 2013-12-30 ENCOUNTER — Telehealth (HOSPITAL_BASED_OUTPATIENT_CLINIC_OR_DEPARTMENT_OTHER): Payer: Self-pay

## 2013-12-30 NOTE — Telephone Encounter (Signed)
Results received from [facility].  (+) Gonorrhea. Treated with Zithromax and Rocephin.  DHHS form completed and faxed.  Call and notify patient.   

## 2014-03-27 ENCOUNTER — Emergency Department (HOSPITAL_COMMUNITY)
Admission: EM | Admit: 2014-03-27 | Discharge: 2014-03-27 | Discharge status: Against Medical Advice | Payer: Self-pay | Attending: Emergency Medicine | Admitting: Emergency Medicine

## 2014-03-27 ENCOUNTER — Encounter (HOSPITAL_COMMUNITY): Payer: Self-pay | Admitting: Emergency Medicine

## 2014-03-27 DIAGNOSIS — R51 Headache: Secondary | ICD-10-CM | POA: Insufficient documentation

## 2014-03-27 DIAGNOSIS — Z720 Tobacco use: Secondary | ICD-10-CM | POA: Insufficient documentation

## 2014-03-27 DIAGNOSIS — R0981 Nasal congestion: Secondary | ICD-10-CM | POA: Insufficient documentation

## 2014-03-27 NOTE — ED Notes (Signed)
Pt not in waiting room

## 2014-03-27 NOTE — ED Notes (Signed)
Pt did not respond to second page to treatment area. Not in waiting room

## 2014-03-27 NOTE — ED Notes (Signed)
Called x3. Not in any area of the lobby

## 2014-03-27 NOTE — ED Notes (Signed)
Pt reports headache, pressure behind his eyes and nasal congestion.  Pt reports that he's been having this on and off for years.  States that he has not been able to breathe through his nose.  States that when he gets checked out he is always told that it's viral infection but feels like there's more serious going on.

## 2014-03-30 ENCOUNTER — Emergency Department (HOSPITAL_COMMUNITY)
Admission: EM | Admit: 2014-03-30 | Discharge: 2014-03-30 | Disposition: A | Discharge status: Home / Self Care | Payer: Self-pay | Attending: Emergency Medicine | Admitting: Emergency Medicine

## 2014-03-30 ENCOUNTER — Emergency Department (HOSPITAL_COMMUNITY): Payer: Self-pay

## 2014-03-30 ENCOUNTER — Encounter (HOSPITAL_COMMUNITY): Payer: Self-pay | Admitting: Emergency Medicine

## 2014-03-30 DIAGNOSIS — R079 Chest pain, unspecified: Secondary | ICD-10-CM | POA: Insufficient documentation

## 2014-03-30 DIAGNOSIS — Z720 Tobacco use: Secondary | ICD-10-CM | POA: Insufficient documentation

## 2014-03-30 DIAGNOSIS — M419 Scoliosis, unspecified: Secondary | ICD-10-CM | POA: Insufficient documentation

## 2014-03-30 DIAGNOSIS — J320 Chronic maxillary sinusitis: Secondary | ICD-10-CM | POA: Insufficient documentation

## 2014-03-30 DIAGNOSIS — Z7982 Long term (current) use of aspirin: Secondary | ICD-10-CM | POA: Insufficient documentation

## 2014-03-30 HISTORY — DX: Scoliosis, unspecified: M41.9

## 2014-03-30 LAB — CBC
HCT: 43.8 % (ref 39.0–52.0)
Hemoglobin: 14.5 g/dL (ref 13.0–17.0)
MCH: 29.5 pg (ref 26.0–34.0)
MCHC: 33.1 g/dL (ref 30.0–36.0)
MCV: 89.2 fL (ref 78.0–100.0)
PLATELETS: 257 10*3/uL (ref 150–400)
RBC: 4.91 MIL/uL (ref 4.22–5.81)
RDW: 12.7 % (ref 11.5–15.5)
WBC: 8.8 10*3/uL (ref 4.0–10.5)

## 2014-03-30 LAB — BASIC METABOLIC PANEL
ANION GAP: 10 (ref 5–15)
BUN: 13 mg/dL (ref 6–23)
CALCIUM: 9.7 mg/dL (ref 8.4–10.5)
CO2: 27 mEq/L (ref 19–32)
Chloride: 99 mEq/L (ref 96–112)
Creatinine, Ser: 1.13 mg/dL (ref 0.50–1.35)
GFR calc Af Amer: >90 mL/min (ref >90)
GFR, EST NON AFRICAN AMERICAN: 89 mL/min — AB (ref >90)
GLUCOSE: 70 mg/dL (ref 70–99)
Potassium: 4.5 mEq/L (ref 3.7–5.3)
SODIUM: 136 meq/L — AB (ref 137–147)

## 2014-03-30 LAB — I-STAT TROPONIN, ED: TROPONIN I, POC: 0 ng/mL (ref 0.00–0.08)

## 2014-03-30 MED ORDER — AMOXICILLIN-POT CLAVULANATE 875-125 MG PO TABS: 1.0000 | ORAL_TABLET | Freq: Two times a day (BID) | ORAL | Status: DC

## 2014-03-30 MED ORDER — CETIRIZINE HCL 10 MG PO CAPS: 10.0000 mg | ORAL_CAPSULE | Freq: Every day | ORAL | Status: DC

## 2014-03-30 MED ORDER — FLUTICASONE PROPIONATE 50 MCG/ACT NA SUSP: 2.0000 | Freq: Every day | NASAL | Status: DC

## 2014-03-30 NOTE — Discharge Instructions (Signed)
Sinusitis °Sinusitis is redness, soreness, and inflammation of the paranasal sinuses. Paranasal sinuses are air pockets within the bones of your face (beneath the eyes, the middle of the forehead, or above the eyes). In healthy paranasal sinuses, mucus is able to drain out, and air is able to circulate through them by way of your nose. However, when your paranasal sinuses are inflamed, mucus and air can become trapped. This can allow bacteria and other germs to grow and cause infection. °Sinusitis can develop quickly and last only a short time (acute) or continue over a long period (chronic). Sinusitis that lasts for more than 12 weeks is considered chronic.  °CAUSES  °Causes of sinusitis include: °· Allergies. °· Structural abnormalities, such as displacement of the cartilage that separates your nostrils (deviated septum), which can decrease the air flow through your nose and sinuses and affect sinus drainage. °· Functional abnormalities, such as when the small hairs (cilia) that line your sinuses and help remove mucus do not work properly or are not present. °SIGNS AND SYMPTOMS  °Symptoms of acute and chronic sinusitis are the same. The primary symptoms are pain and pressure around the affected sinuses. Other symptoms include: °· Upper toothache. °· Earache. °· Headache. °· Bad breath. °· Decreased sense of smell and taste. °· A cough, which worsens when you are lying flat. °· Fatigue. °· Fever. °· Thick drainage from your nose, which often is green and may contain pus (purulent). °· Swelling and warmth over the affected sinuses. °DIAGNOSIS  °Your health care provider will perform a physical exam. During the exam, your health care provider may: °· Look in your nose for signs of abnormal growths in your nostrils (nasal polyps). °· Tap over the affected sinus to check for signs of infection. °· View the inside of your sinuses (endoscopy) using an imaging device that has a light attached (endoscope). °If your health  care provider suspects that you have chronic sinusitis, one or more of the following tests may be recommended: °· Allergy tests. °· Nasal culture. A sample of mucus is taken from your nose, sent to a lab, and screened for bacteria. °· Nasal cytology. A sample of mucus is taken from your nose and examined by your health care provider to determine if your sinusitis is related to an allergy. °TREATMENT  °Most cases of acute sinusitis are related to a viral infection and will resolve on their own within 10 days. Sometimes medicines are prescribed to help relieve symptoms (pain medicine, decongestants, nasal steroid sprays, or saline sprays).  °However, for sinusitis related to a bacterial infection, your health care provider will prescribe antibiotic medicines. These are medicines that will help kill the bacteria causing the infection.  °Rarely, sinusitis is caused by a fungal infection. In theses cases, your health care provider will prescribe antifungal medicine. °For some cases of chronic sinusitis, surgery is needed. Generally, these are cases in which sinusitis recurs more than 3 times per year, despite other treatments. °HOME CARE INSTRUCTIONS  °· Drink plenty of water. Water helps thin the mucus so your sinuses can drain more easily. °· Use a humidifier. °· Inhale steam 3 to 4 times a day (for example, sit in the bathroom with the shower running). °· Apply a warm, moist washcloth to your face 3 to 4 times a day, or as directed by your health care provider. °· Use saline nasal sprays to help moisten and clean your sinuses. °· Take medicines only as directed by your health care provider. °·   If you were prescribed either an antibiotic or antifungal medicine, finish it all even if you start to feel better. °SEEK IMMEDIATE MEDICAL CARE IF: °· You have increasing pain or severe headaches. °· You have nausea, vomiting, or drowsiness. °· You have swelling around your face. °· You have vision problems. °· You have a stiff  neck. °· You have difficulty breathing. °MAKE SURE YOU:  °· Understand these instructions. °· Will watch your condition. °· Will get help right away if you are not doing well or get worse. °Document Released: 05/25/2005 Document Revised: 10/09/2013 Document Reviewed: 06/09/2011 °ExitCare® Patient Information ©2015 [employer]. This information is not intended to replace advice given to you by your health care provider. Make sure you discuss any questions you have with your health care provider. ° °Emergency Department Resource Guide °1) Find a Doctor and Pay Out of Pocket °Although you won't have to find out who is covered by your insurance plan, it is a good idea to ask around and get recommendations. You will then need to call the office and see if the doctor you have chosen will accept you as a new patient and what types of options they offer for patients who are self-pay. Some doctors offer discounts or will set up payment plans for their patients who do not have insurance, but you will need to ask so you aren't surprised when you get to your appointment. ° °2) Contact Your Local Health Department °Not all health departments have doctors that can see patients for sick visits, but many do, so it is worth a call to see if yours does. If you don't know where your local health department is, you can check in your phone book. The CDC also has a tool to help you locate your state's health department, and many state websites also have listings of all of their local health departments. ° °3) Find a Walk-in Clinic °If your illness is not likely to be very severe or complicated, you may want to try a walk in clinic. These are popping up all over the country in pharmacies, drugstores, and shopping centers. They're usually staffed by nurse practitioners or physician assistants that have been trained to treat common illnesses and complaints. They're usually fairly quick and inexpensive. However, if you have serious medical  issues or chronic medical problems, these are probably not your best option. ° °No Primary Care Doctor: °- Call [facility] at  832-8000 - they can help you locate a primary care doctor that  accepts your insurance, provides certain services, etc. °- Physician Referral Service- 1-800-533-3463 ° °Chronic Pain Problems: °Organization         Address  Phone   Notes  °[facility]  (336) 297-2271 Patients need to be referred by their primary care doctor.  ° °Medication Assistance: °Organization         Address  Phone   Notes  °[place] [facility] [address] °[place], [place] [address] (336) 641-8030 --Must be a resident of [place] °-- Must have NO insurance coverage whatsoever (no Medicaid/ Medicare, etc.) °-- The pt. MUST have a primary care doctor that directs their care regularly and follows them in the community °  °[facility]  (866) 331-1348   °[employer]  (888) 892-1162   ° °Agencies that provide inexpensive medical care: °Organization         Address  Phone   Notes  °[facility]  (336)   832-8035   °[facility]    (336) 832-7272   °[facility] [address] °[place], [place] [address] (336) 832-4777   °[facility] [address], °[place] (336) 271-4999   °[facility]    (336) 373-0678   °[facility]    (336) 272-1050   °[facility] ° [address], [place] Phone:  (336) 832-4444, Fax:  (336) 832-4440 Hours of Operation:  9 am - 6 pm, M-F.  Also accepts Medicaid/Medicare and self-pay.  °[facility] ° [address], [place] Phone: (336) 832-3150, Fax: (336) 832-3151. Hours of Operation:  8:30 am - 5:30 pm, M-F.  Also accepts Medicaid and self-pay.  °[facility] [place] [address], [place] Phone: (336) 878-6027   °[facility] [address], [place], [place]  (336)723-1848, Ext. 123 Mondays & Thursdays: 7-9 AM.  First 15 patients are seen on a first come, first serve basis. °  ° °Medicaid-accepting [place] Providers: ° °Organization         Address  Phone   Notes  °[facility] [address], [place] (336) 641-2100 Also accepts self-pay patients.  °[facility] [address], [place] ° (336) 856-9996   °[facility] [address], [place] (336) 288-8857   °[facility] [address] [place] [address], [place] (336) 299-7000   °Veita Bland [address], [place]  ° (336) 373-1557 Only accepts [place] Access Medicaid patients after they have their name applied to their card.  ° °Self-Pay (no insurance) in [place]: ° °Organization         Address  Phone   Notes  °Sickle Cell Patients, [facility] [address], [place] (336) 832-1970   °[facility] [address], [place] (336) 832-4400   °[facility] ° [address], [place] (336) 992-4800   °[facility]/Dr. Osei-Bonsu ° [address] [place] [address], [place] or [address], [place] (336) 841-8500 Phone number for both [place] and [place] locations is the same.  °[facility] [address], [place] (336) 299-0000   °[facility] [address] [place] [address], [place] or [address] (336) 852-7530 °(336) 878-2260   °[facility] [address], [place] (336) 350-1642, phone; (336) 294-5005, fax Sees patients 1st and 3rd Saturday of every month.  Must not qualify for public or private insurance (i.e. Medicaid, Medicare, [place] Health Choice, Veterans' Benefits) • Household income should be no more than 200% of the poverty level •The clinic cannot treat you if you are pregnant or think you are pregnant • Sexually transmitted  diseases are not treated at the clinic.  ° ° °Dental Care: °Organization         Address  Phone  Notes  °[facility] [address],  (336) 641-6152 Accepts children up to age 21 who are enrolled in Medicaid or [place] Health Choice; pregnant women with a Medicaid card; and children who have applied for Medicaid or [place] Health Choice, but were declined, whose parents can pay a reduced fee at time of service.  °[facility] [place]  [address], [place] (336) 641-7733 Accepts children up to age 21 who are enrolled in Medicaid or [place] Health Choice; pregnant women with a Medicaid card; and children who have applied for Medicaid or [place] Health Choice, but were declined, whose parents can pay a reduced fee at time of service.  °[facility] ° [address], [place] (336) 641-4533 Patients are seen by appointment only. Walk-ins are not accepted. [facility] will see patients 18 years of age and older. °Monday - Tuesday (8am-5pm) °Most Wednesdays (8:30-5pm) °$30 per visit, cash only  °[facility] ° [address], [place] (336) 641-4533 Patients are seen by appointment only. Walk-ins are not accepted. [facility] will see patients 18 years of age and older. °One Wednesday Evening (Monthly: Volunteer Based).  $30 per visit, cash only  °[facility]  (919) 537-3737 for adults; Children under age 4, call Graduate Pediatric Dentistry at ([phone]) [phone]. Children aged 4-14, please call (919) [phone] to request a pediatric application. ° Dental services are provided in all areas of dental care including fillings, crowns and bridges, complete and partial dentures, implants, gum treatment, root canals, and extractions. Preventive care is also provided. Treatment is provided to both adults and children. °Patients are selected via a  lottery and there is often a waiting list. °  °[facility] [address], °[place] ° (336) 763-8833 [email or web address] °  °[facility] [address], [place], [place] (336)723-1848, Ext. 123 Second and Fourth Thursday of each month, opens at 6:30 AM; Clinic ends at 9 AM.  Patients are seen on a first-come first-served basis, and a limited number are seen during each clinic.  ° °[facility] ° [address], [place], [place] (336) 723-7904   Eligibility Requirements °You must have lived in [place], [place], or [place] counties for at least the last three months. °  You cannot be eligible for state or federal sponsored healthcare insurance, including Veterans Administration, Medicaid, or Medicare. °  You generally cannot be eligible for healthcare insurance through your employer.  °  How to apply: °Eligibility screenings are held every Tuesday and Wednesday afternoon from 1:00 pm until 4:00 pm. You do not need an appointment for the interview!  °[facility] [address], [place], [place] 336-631-2330   °[facility]  336-342-8273   °[facility]  336-703-3100   °[facility]  336-570-6415   ° °Behavioral Health Resources in the Community: °Intensive Outpatient Programs °Organization         Address  Phone  Notes  °[facility] [address], [place], [place] 336-878-6098   °[facility] [address], [place], [place] 336-832-9800   °[facility] [address], [place], [place] ° 336-882-2125   °[facility] [address],  °, [place] 1-800-853-5163 or 336-641-4981   °Substance Abuse Resources °Organization         Address  Phone  Notes  °[facility]  336-882-2125   °[facility]  336-784-9470   °[facility]  336-285-9073   °[facility]  336-845-3988   °Residential &  Outpatient Substance Abuse Program  1-800-659-3381   °Psychological Services °Organization         Address  Phone  Notes  °[facility]  336- 832-9600   °[facility]  336- 378-7881   °[facility] [address], [place] 1-800-853-5163 or 336-641-4981   ° °Mobile Crisis Teams °Organization         Address  Phone  Notes  °[facility], Mobile Crisis Care Unit  1-877-626-1772   °Assertive °[facility] ° [address] [place], [place] 336-834-9664   °Sharon DeEsch [address] °[place] 336-554-5454   ° °Self-Help/Support Groups °Organization         Address  Phone             Notes  °[facility] [place] - variety of support groups  336- 373-1402 Call for more information  °Narcotics Anonymous (NA), [facility] [address], °[place]  2 meetings at this location  ° °Residential Treatment Programs °Organization         Address  Phone  Notes  °[facility] [address],    °[place]  1-866-801-8205   °[facility] ° [address], [place], [place] 704-293-8524   °[facility] [address], [place] 336-845-3988 Admissions: 8am-3pm M-F  °[facility] [address]    °[place], [place] 336-841-1104   °[facility] [address], [place], [place] 336-379-7146   °[facility] [address]  °[place], [place] 336-285-9073   °[facility] - Intensive Outpatient [address], [place], [place] 336-852-3033   °[facility] [address]  °[place], [place] 1-877-615-2722 or 336-784-9470   °[facility] [address] [place], [place] 336-227-7417 Accepts Medicaid  °[facility] [address]  °[place] 1-800-659-3381 Substance Abuse/Addiction Treatment  ° °[place] Behavioral Health Resources °Organization          Address  Phone  Notes  °[facility]  (888) 581-9988   °Julie Brannon, PhD [address] [place], [place]   (336) 349-5553 or (336) 951-0000   °[facility]   [address] °[place], [place] (336) 349-4454   °[facility] [address], [place], [place] (336) 342-8316 Insurance/Medicaid/sponsorship through [facility]  °[facility] [address]                                    [place], [place] (336) 342-8316 Therapy/tele-psych/case  °[facility] [address]  ° [place], [place] (336) 349-2233    °Dr. Arfeen  (336) 349-4544   °[facility] [place]  [employer] [place] [facility] 1) [address], [place] °2) [address], [place] °3)  [address], [place] (336) 349-3220 °(336) 342-7768 ° °(336) 342-8140   °[place] [facility] (336) 342-1394 or (336) 342-3537 (After Hours)    ° ° °

## 2014-03-30 NOTE — ED Notes (Signed)
Pt adds that he had an episode yesterday of chest pain, dizziness, sob and felt his heart was racing. None of these symptoms at present

## 2014-03-30 NOTE — ED Provider Notes (Signed)
CSN: [id number]     Arrival date & time 03/30/14  1036 History   First MD Initiated Contact with Patient 03/30/14 1105     Chief Complaint  Patient presents with  . Nasal Congestion  . Chest Pain   Patient is a 23 y.o. male presenting with chest pain and URI. The history is provided by the patient. No language interpreter was used.  Chest Pain Pain location:  L chest Pain quality: sharp   Pain radiates to:  Does not radiate Pain radiates to the back: no   Pain severity:  Mild Onset quality:  Sudden Timing:  Intermittent Progression:  Unchanged Chronicity:  New Context: at rest and stress   Context: not breathing, no drug use, not eating, no intercourse, not lifting, no movement and not raising an arm   Relieved by:  Nothing Worsened by:  Nothing tried Ineffective treatments:  None tried Associated symptoms: anxiety and cough   Associated symptoms: no abdominal pain, no anorexia, no back pain, no claudication, no diaphoresis, no dizziness, no dysphagia, no fatigue, no fever, no headache, no heartburn, no lower extremity edema, no nausea, no near-syncope, no numbness, no orthopnea, no palpitations, no PND, no shortness of breath, no syncope, not vomiting and no weakness   Risk factors: male sex and smoking   Risk factors: no aortic disease, no birth control, no coronary artery disease, no diabetes mellitus, no Ehlers-Danlos syndrome, no high cholesterol, no hypertension, no immobilization, no Marfan's syndrome, not obese, not pregnant, no prior DVT/PE and no surgery   URI Presenting symptoms: cough   Presenting symptoms: no fatigue and no fever   Severity:  Mild Onset quality:  Gradual Duration:  4 weeks Timing:  Sporadic Chronicity:  Chronic (Chronic congestion) Relieved by:  Nothing Worsened by:  Nothing tried Ineffective treatments:  OTC medications, rest, decongestant, drinking and breathing Associated symptoms: sinus pain and sneezing   Associated symptoms: no arthralgias,  no headaches, no myalgias, no neck pain, no swollen glands and no wheezing   Risk factors: not elderly, no chronic cardiac disease, no chronic kidney disease, no chronic respiratory disease, no diabetes mellitus, no immunosuppression, no recent illness, no recent travel and no sick contacts     Past Medical History  Diagnosis Date  . Bronchitis   . Scoliosis    History reviewed. No pertinent past surgical history. History reviewed. No pertinent family history. History  Substance Use Topics  . Smoking status: Current Every Day Smoker -- 0.20 packs/day    Types: Cigarettes  . Smokeless tobacco: Never Used  . Alcohol Use: Yes     Comment: occ    Review of Systems  Constitutional: Negative for fever, diaphoresis and fatigue.  HENT: Positive for sneezing. Negative for trouble swallowing.   Respiratory: Positive for cough. Negative for shortness of breath and wheezing.   Cardiovascular: Positive for chest pain. Negative for palpitations, orthopnea, claudication, syncope, PND and near-syncope.  Gastrointestinal: Negative for heartburn, nausea, vomiting, abdominal pain and anorexia.  Musculoskeletal: Negative for arthralgias, back pain, myalgias and neck pain.  Neurological: Negative for dizziness, weakness, numbness and headaches.  All other systems reviewed and are negative.     Allergies  Review of patient's allergies indicates no known allergies.  Home Medications   Prior to Admission medications   Medication Sig Start Date End Date Taking? Authorizing Provider  aspirin 325 MG tablet Take 325 mg by mouth daily.   Yes Historical Provider, MD  amoxicillin-clavulanate (AUGMENTIN) 875-125 MG per tablet Take 1 tablet  by mouth 2 (two) times daily. One po bid x 7 days 03/30/14   Stokely Jeancharles A Forcucci, PA-C  Cetirizine HCl (ZYRTEC ALLERGY) 10 MG CAPS Take 1 capsule (10 mg total) by mouth at bedtime. 03/30/14   Kosei Rhodes A Forcucci, PA-C  fluticasone (FLONASE) 50 MCG/ACT nasal spray Place  2 sprays into both nostrils daily. 03/30/14   Mistey Hoffert A Forcucci, PA-C   BP 121/77  Pulse 78  Temp(Src) 98.1 F (36.7 C) (Oral)  Resp 16  SpO2 100% Physical Exam  Nursing note and vitals reviewed. Constitutional: He is oriented to person, place, and time. He appears well-developed and well-nourished. No distress.  HENT:  Head: Normocephalic and atraumatic.  Right Ear: Hearing, tympanic membrane and external ear normal.  Left Ear: Hearing, tympanic membrane and external ear normal.  Nose: Mucosal edema present. Right sinus exhibits maxillary sinus tenderness and frontal sinus tenderness. Left sinus exhibits maxillary sinus tenderness and frontal sinus tenderness.  Mouth/Throat: Oropharynx is clear and moist. No oropharyngeal exudate.  Eyes: Conjunctivae and EOM are normal. Pupils are equal, round, and reactive to light. No scleral icterus.  Neck: Normal range of motion. Neck supple. No JVD present. No thyromegaly present.  Cardiovascular: Normal rate, regular rhythm, normal heart sounds and intact distal pulses.  Exam reveals no gallop and no friction rub.   No murmur heard. Pulmonary/Chest: Effort normal and breath sounds normal. No respiratory distress. He has no wheezes. He has no rales. He exhibits no tenderness.  Abdominal: Soft. Bowel sounds are normal. He exhibits no distension and no mass. There is no tenderness. There is no rebound and no guarding.  Lymphadenopathy:    He has no cervical adenopathy.  Neurological: He is alert and oriented to person, place, and time.  Skin: Skin is warm and dry. He is not diaphoretic.  Psychiatric: He has a normal mood and affect. His behavior is normal. Judgment and thought content normal.    ED Course  Procedures (including critical care time) Labs Review Labs Reviewed  BASIC METABOLIC PANEL - Abnormal; Notable for the following:    Sodium 136 (*)    GFR calc non Af Amer 89 (*)    All other components within normal limits  CBC  I-STAT  TROPOININ, ED    Imaging Review Dg Chest 2 View (if Patient Has Fever And/or Copd)  03/30/2014   CLINICAL DATA:  Head congestion and wound chest pressure. Lightheadedness. Nonsmoker.  EXAM: CHEST  2 VIEW  COMPARISON:  09/18/2011.  FINDINGS: Cardiac silhouette is normal in size and configuration. No mediastinal or hilar masses or convincing adenopathy. Lungs are clear. No pleural effusion or pneumothorax. Sharp dextroscoliosis of the upper thoracic spine is stable.  IMPRESSION: No acute cardiopulmonary disease.   Electronically Signed   By: Amie Portlandavid  Ormond M.D.   On: 03/30/2014 12:07     EKG Interpretation None       MDM   Final diagnoses:  Chronic maxillary sinusitis   Patient is a 23 y.o. Male who presents to the ED with chest pain and also congestion and sinus pressure.  Physical exam reveals severe mucosal edema.  Suspect that this is likely chronic sinusitis vs rebound sinusitis secondary to afrin use vs. Allergic rhinitis.  CXR is negative.  CBC, BMP, and troponin are negative.  Will start treating with zyrtec, flonase, and augmentin.  Patient to [facility].  Patient to return for signs of pneumonia.  Patient is stable for discharge.  Patient's chest pain  is likely related to anxiety.  Patient has a HEART score of 2 and is low risk for cardiac events.  Patient to return for syncope and worsening SOB.  Patient states understanding and agreement.      Eben Burow, PA-C 03/30/14 1321

## 2014-03-30 NOTE — ED Notes (Addendum)
Pt reports nasal congestion for past 2 weeks. Has nasal congestion throughout the year, but is worse than usual lately. Denies injury to nose. Pt has tried multiple otc treatment. Has had a sore throat for the past week that is clearing up now. Pt also has productive cough at times.

## 2014-03-30 NOTE — ED Provider Notes (Signed)
Medical screening examination/treatment/procedure(s) were performed by non-physician practitioner and as supervising physician I was immediately available for consultation/collaboration.   EKG Interpretation   Date/Time:  Friday March 30 2014 11:27:59 EDT Ventricular Rate:  76 PR Interval:  168 QRS Duration: 105 QT Interval:  379 QTC Calculation: 426 R Axis:   69 Text Interpretation:  Sinus rhythm RSR' in V1 or V2, probably normal  variant ST elevation throughout w/o reciprocal changes, likely early  repolariztion No significant change since last tracing Confirmed by  DOCHERTY  MD, MEGAN [phone] on 03/30/2014 1:21:42 PM        Toy CookeyMegan Docherty, MD 03/30/14 1704

## 2014-10-31 ENCOUNTER — Encounter (HOSPITAL_COMMUNITY): Payer: Self-pay | Admitting: Emergency Medicine

## 2014-10-31 ENCOUNTER — Emergency Department (HOSPITAL_COMMUNITY)
Admission: EM | Admit: 2014-10-31 | Discharge: 2014-10-31 | Disposition: A | Discharge status: Home / Self Care | Payer: Self-pay | Attending: Emergency Medicine | Admitting: Emergency Medicine

## 2014-10-31 DIAGNOSIS — Z7951 Long term (current) use of inhaled steroids: Secondary | ICD-10-CM | POA: Insufficient documentation

## 2014-10-31 DIAGNOSIS — Z792 Long term (current) use of antibiotics: Secondary | ICD-10-CM | POA: Insufficient documentation

## 2014-10-31 DIAGNOSIS — Z87891 Personal history of nicotine dependence: Secondary | ICD-10-CM | POA: Insufficient documentation

## 2014-10-31 DIAGNOSIS — Z8739 Personal history of other diseases of the musculoskeletal system and connective tissue: Secondary | ICD-10-CM | POA: Insufficient documentation

## 2014-10-31 DIAGNOSIS — J330 Polyp of nasal cavity: Secondary | ICD-10-CM

## 2014-10-31 DIAGNOSIS — R0981 Nasal congestion: Secondary | ICD-10-CM | POA: Insufficient documentation

## 2014-10-31 DIAGNOSIS — Z7982 Long term (current) use of aspirin: Secondary | ICD-10-CM | POA: Insufficient documentation

## 2014-10-31 DIAGNOSIS — Z79899 Other long term (current) drug therapy: Secondary | ICD-10-CM | POA: Insufficient documentation

## 2014-10-31 DIAGNOSIS — J339 Nasal polyp, unspecified: Secondary | ICD-10-CM | POA: Insufficient documentation

## 2014-10-31 MED ORDER — PREDNISONE 10 MG PO TABS: ORAL_TABLET | ORAL | Status: DC

## 2014-10-31 NOTE — ED Notes (Signed)
Patient c/o nasal congestion x 3 years.  States had nosebleed today.

## 2014-11-03 NOTE — ED Provider Notes (Signed)
CSN: [id number]     Arrival date & time 10/31/14  2054 History   First MD Initiated Contact with Patient 10/31/14 2105     Chief Complaint  Patient presents with  . Nasal Congestion     (Consider location/radiation/quality/duration/timing/severity/associated sxs/prior Treatment) HPI   Juan Woodward is a 24 y.o. male who presents to the Emergency Department complaining of long tern nasal congestion.  Reports difficulty breathing from his nose for three years.  States that he has tried multiple OTC allergy medications and uses nasal sprays daily.  Come to ED because he blew his nose earlier and had bleeding from his right nostril for approximately five minutes before it resolved spontaneously.  He denies headache, dizziness, vomiting or visual changes.  Past Medical History  Diagnosis Date  . Bronchitis   . Scoliosis    History reviewed. No pertinent past surgical history. No family history on file. History  Substance Use Topics  . Smoking status: Former Smoker -- 0.20 packs/day    Types: Cigarettes  . Smokeless tobacco: Never Used  . Alcohol Use: Yes     Comment: occ    Review of Systems  Constitutional: Negative for fever, chills, activity change and appetite change.  HENT: Positive for congestion, nosebleeds and rhinorrhea. Negative for ear pain, facial swelling, sore throat and trouble swallowing.   Eyes: Negative for visual disturbance.  Respiratory: Positive for cough. Negative for shortness of breath, wheezing and stridor.   Gastrointestinal: Negative for nausea, vomiting and abdominal pain.  Musculoskeletal: Negative for neck pain and neck stiffness.  Skin: Negative.   Neurological: Negative for dizziness, syncope, facial asymmetry, weakness, numbness and headaches.  Hematological: Negative for adenopathy.  Psychiatric/Behavioral: Negative for confusion.  All other systems reviewed and are negative.     Allergies  Review of patient's allergies indicates  no known allergies.  Home Medications   Prior to Admission medications   Medication Sig Start Date End Date Taking? Authorizing Provider  amoxicillin-clavulanate (AUGMENTIN) 875-125 MG per tablet Take 1 tablet by mouth 2 (two) times daily. One po bid x 7 days 03/30/14   Juan Woodward Forcucci, PA-C  aspirin 325 MG tablet Take 325 mg by mouth daily.    Historical Provider, MD  Cetirizine HCl (ZYRTEC ALLERGY) 10 MG CAPS Take 1 capsule (10 mg total) by mouth at bedtime. 03/30/14   Juan Forcucci, PA-C  fluticasone (FLONASE) 50 MCG/ACT nasal spray Place 2 sprays into both nostrils daily. 03/30/14   Juan Forcucci, PA-C  predniSONE (DELTASONE) 10 MG tablet Take 6 tablets day one, 5 tablets day two, 4 tablets day three, 3 tablets day four, 2 tablets day five, then 1 tablet day six 10/31/14   Juan Samford, PA-C   BP 129/86 mmHg  Pulse 91  Temp(Src) 98.2 F (36.8 C) (Oral)  Resp 24  Ht 6\' 3"  (1.905 m)  Wt 164 lb (74.39 kg)  BMI 20.50 kg/m2  SpO2 100% Physical Exam  Constitutional: He is oriented to person, place, and time. He appears well-developed and well-nourished. No distress.  HENT:  Head: Normocephalic and atraumatic.  Nose: Mucosal edema present. No septal deviation. No epistaxis.  Mouth/Throat: Oropharynx is clear and moist.  Large anterior right nasal polyp.  No epistaxis.    Eyes: Conjunctivae are normal. Pupils are equal, round, and reactive to light.  Neck: Normal range of motion. Neck supple.  Cardiovascular: Normal rate, regular rhythm and intact distal pulses.   No murmur heard. Pulmonary/Chest: Effort normal and breath sounds normal. No respiratory  distress.  Lymphadenopathy:    He has no cervical adenopathy.  Neurological: He is alert and oriented to person, place, and time. Coordination normal.  Skin: Skin is warm and dry.  Psychiatric: He has a normal mood and affect.  Nursing note and vitals reviewed.   ED Course  Procedures (including critical care time) Labs  Review Labs Reviewed - No data to display  Imaging Review No results found.   EKG Interpretation None      MDM   Final diagnoses:  Chronic nasal congestion  Nasal polyp - anterior    Pt is well appearing.  congestion likely related to nasal polyp and probable rebound congestion from OTC nasal sprays.  Referral to ENT, rx for steroid taper. Advised pt to use saline nasal spray and avoid OTC nasal sprays.    Pauline Aus, PA-C 11/03/14 2215  Donnetta Hutching, MD 11/03/14 [phone]

## 2015-03-24 ENCOUNTER — Emergency Department (HOSPITAL_COMMUNITY)
Admission: EM | Admit: 2015-03-24 | Discharge: 2015-03-24 | Disposition: A | Discharge status: Home / Self Care | Payer: Self-pay

## 2015-03-24 NOTE — ED Notes (Signed)
Called for triage x1 1 no response.

## 2015-03-27 ENCOUNTER — Encounter (HOSPITAL_COMMUNITY): Payer: Self-pay | Admitting: *Deleted

## 2015-03-27 ENCOUNTER — Emergency Department (HOSPITAL_COMMUNITY)
Admission: EM | Admit: 2015-03-27 | Discharge: 2015-03-27 | Disposition: A | Discharge status: Home / Self Care | Payer: Self-pay | Attending: Emergency Medicine | Admitting: Emergency Medicine

## 2015-03-27 DIAGNOSIS — Y998 Other external cause status: Secondary | ICD-10-CM | POA: Insufficient documentation

## 2015-03-27 DIAGNOSIS — M419 Scoliosis, unspecified: Secondary | ICD-10-CM | POA: Insufficient documentation

## 2015-03-27 DIAGNOSIS — Y9389 Activity, other specified: Secondary | ICD-10-CM | POA: Insufficient documentation

## 2015-03-27 DIAGNOSIS — Z7982 Long term (current) use of aspirin: Secondary | ICD-10-CM | POA: Insufficient documentation

## 2015-03-27 DIAGNOSIS — Z7951 Long term (current) use of inhaled steroids: Secondary | ICD-10-CM | POA: Insufficient documentation

## 2015-03-27 DIAGNOSIS — Z8709 Personal history of other diseases of the respiratory system: Secondary | ICD-10-CM | POA: Insufficient documentation

## 2015-03-27 DIAGNOSIS — Z87891 Personal history of nicotine dependence: Secondary | ICD-10-CM | POA: Insufficient documentation

## 2015-03-27 DIAGNOSIS — S0086XA Insect bite (nonvenomous) of other part of head, initial encounter: Secondary | ICD-10-CM | POA: Insufficient documentation

## 2015-03-27 DIAGNOSIS — W57XXXA Bitten or stung by nonvenomous insect and other nonvenomous arthropods, initial encounter: Secondary | ICD-10-CM | POA: Insufficient documentation

## 2015-03-27 DIAGNOSIS — Y9289 Other specified places as the place of occurrence of the external cause: Secondary | ICD-10-CM | POA: Insufficient documentation

## 2015-03-27 NOTE — ED Notes (Signed)
Pt reports he was standing under a tree, spider fell on his face, he felt itching and burning to right side of lip. Scant swelling to area. Denies pain.

## 2015-03-27 NOTE — ED Provider Notes (Signed)
CSN: [id number]     Arrival date & time 03/27/15  1015 History   First MD Initiated Contact with Patient 03/27/15 1022     Chief Complaint  Patient presents with  . Insect Bite     (Consider location/radiation/quality/duration/timing/severity/associated sxs/prior Treatment) HPI Comments: Patient presents with complaint of spider bite right cheek. Patient states that he was bitten by a small black spider approximately 30 minutes prior to arrival. Patient initially had some minor facial swelling and redness, burning sensation to the right side of the face. No difficulty breathing, wheezing, significant lip swelling or tongue swelling. No history of anaphylaxis. No treatments prior to arrival. Symptoms now nearly resolved.  The history is provided by the patient.    Past Medical History  Diagnosis Date  . Bronchitis   . Scoliosis    History reviewed. No pertinent past surgical history. History reviewed. No pertinent family history. Social History  Substance Use Topics  . Smoking status: Former Smoker -- 0.20 packs/day    Types: Cigarettes  . Smokeless tobacco: Never Used  . Alcohol Use: Yes     Comment: occ    Review of Systems  Constitutional: Negative for fever.  HENT: Positive for facial swelling. Negative for trouble swallowing.   Eyes: Negative for redness.  Respiratory: Negative for shortness of breath, wheezing and stridor.   Cardiovascular: Negative for chest pain.  Gastrointestinal: Negative for nausea and vomiting.  Musculoskeletal: Negative for myalgias.  Skin: Positive for color change. Negative for rash.  Neurological: Negative for light-headedness.  Psychiatric/Behavioral: Negative for confusion.      Allergies  Review of patient's allergies indicates no known allergies.  Home Medications   Prior to Admission medications   Medication Sig Start Date End Date Taking? Authorizing Provider  amoxicillin-clavulanate (AUGMENTIN) 875-125 MG per tablet Take 1  tablet by mouth 2 (two) times daily. One po bid x 7 days 03/30/14   Terri Piedra, PA-C  aspirin 325 MG tablet Take 325 mg by mouth daily.    Historical Provider, MD  Cetirizine HCl (ZYRTEC ALLERGY) 10 MG CAPS Take 1 capsule (10 mg total) by mouth at bedtime. 03/30/14   Courtney Forcucci, PA-C  fluticasone (FLONASE) 50 MCG/ACT nasal spray Place 2 sprays into both nostrils daily. 03/30/14   Courtney Forcucci, PA-C  predniSONE (DELTASONE) 10 MG tablet Take 6 tablets day one, 5 tablets day two, 4 tablets day three, 3 tablets day four, 2 tablets day five, then 1 tablet day six 10/31/14   Tammy Triplett, PA-C   BP 142/89 mmHg  Pulse 75  Temp(Src) 98.2 F (36.8 C) (Oral)  Resp 16  SpO2 98%   Physical Exam  Constitutional: He appears well-developed and well-nourished.  HENT:  Head: Normocephalic and atraumatic.  Mouth/Throat: Oropharynx is clear and moist.  No angioedema. Minimal swelling to right zygoma area, barely noticeable. No abscess or cellulitis.  Eyes: Conjunctivae are normal.  Neck: Normal range of motion. Neck supple.  Pulmonary/Chest: No respiratory distress.  Neurological: He is alert.  Skin: Skin is warm and dry.  Psychiatric: He has a normal mood and affect.  Nursing note and vitals reviewed.   ED Course  Procedures (including critical care time) Labs Review Labs Reviewed - No data to display  Imaging Review No results found. I have personally reviewed and evaluated these images and lab results as part of my medical decision-making.   EKG Interpretation None      10:43 AM Patient seen and examined.   Vital signs reviewed and  are as follows: BP 142/89 mmHg  Pulse 75  Temp(Src) 98.2 F (36.8 C) (Oral)  Resp 16  SpO2 98%    Patient with normal physical exam. Counseled to use Benadryl if needed. Return to the emergency department and call 911 immediately with lip, tongue, throat swelling or trouble breathing. Patient verbalizes understanding and agrees with  plan.   MDM   Final diagnoses:  Insect bite   Patients with insect bite, temporary skin reaction, now improved. No signs of anaphylaxis. No signs of cellulitis or abscess.   Renne CriglerJoshua Kaysee Hergert, PA-C 03/27/15 1047  Arby BarretteMarcy Pfeiffer, MD 03/31/15 [phone]

## 2015-03-27 NOTE — Discharge Instructions (Signed)
Please read and follow all provided instructions.  Your diagnoses today include:  1. Insect bite     Tests performed today include:  Vital signs. See below for your results today.   Medications prescribed:   Benadryl (diphenhydramine) - antihistamine  You can find this medication over-the-counter.   DO NOT exceed:   50mg  Benadryl every 6 hours    Benadryl will make you drowsy. DO NOT drive or perform any activities that require you to be awake and alert if taking this.   Take any prescribed medications only as directed.  Home care instructions:   Follow any educational materials contained in this packet  Follow-up instructions: Please follow-up with your primary care provider in the next 3 days for further evaluation of your symptoms if not improved.   Return instructions:   Please return to the Emergency Department if you experience worsening symptoms.   Call 9-1-1 immediately if you have an allergic reaction that involves your lips, mouth, throat or if you have any difficulty breathing. This is a life-threatening emergency.   Please return if you have any other emergent concerns.  Additional Information:  Your vital signs today were: BP 142/89 mmHg   Pulse 75   Temp(Src) 98.2 F (36.8 C) (Oral)   Resp 16   SpO2 98% If your blood pressure (BP) was elevated above 135/85 this visit, please have this repeated by your doctor within one month. --------------

## 2015-10-27 ENCOUNTER — Encounter (HOSPITAL_COMMUNITY): Payer: Self-pay | Admitting: Emergency Medicine

## 2015-10-27 ENCOUNTER — Emergency Department (HOSPITAL_COMMUNITY)
Admission: EM | Admit: 2015-10-27 | Discharge: 2015-10-27 | Disposition: A | Discharge status: Home / Self Care | Payer: Self-pay | Attending: Emergency Medicine | Admitting: Emergency Medicine

## 2015-10-27 DIAGNOSIS — Z79899 Other long term (current) drug therapy: Secondary | ICD-10-CM | POA: Insufficient documentation

## 2015-10-27 DIAGNOSIS — Z87891 Personal history of nicotine dependence: Secondary | ICD-10-CM | POA: Insufficient documentation

## 2015-10-27 DIAGNOSIS — J329 Chronic sinusitis, unspecified: Secondary | ICD-10-CM | POA: Insufficient documentation

## 2015-10-27 MED ORDER — FEXOFENADINE-PSEUDOEPHED ER 60-120 MG PO TB12: 1.0000 | ORAL_TABLET | Freq: Two times a day (BID) | ORAL | Status: DC

## 2015-10-27 NOTE — ED Provider Notes (Signed)
CSN: [id number]     Arrival date & time 10/27/15  1716 History  By signing my name below, I, Linna DarnerRussell Turner, attest that this documentation has been prepared under the direction and in the presence of non-physician practitioner, Burgess AmorJulie Sangeeta Youse, PA-C. Electronically Signed: Linna Darnerussell Turner, Scribe. 10/27/2015. 6:15 PM.     Chief Complaint  Patient presents with  . Nasal Congestion    The history is provided by the patient. No language interpreter was used.     HPI Comments: Juan Woodward is a 25 y.o. male with h/o bronchitis who presents to the Emergency Department complaining of mouth sores onset 3 days. Pt reports that 3 days ago he noticed a hard lump on the inside of his mouth. He reports that he noticed an additional, smaller lump on the inside of his mouth when he woke up this morning. He states that the oral lumps are not painful and are unchanged since onset.  He also complains of a persistent dry cough for "a while" as well as intermittent nasal congestion for several years. He notes that he experiences right nostril swelling and sinus pressure when his congestion presents. Pt has been prescribed  Zyrtec and Augmentin for his sinus issues in the past with significant relief. He has also tried Mucinex and other OTC medications for his nasal congestion and sinus issues with no relief. He denies nasal injuries. Pt reports that he occasionally sees blood when he blows his nose. He states that he had a cold two weeks ago. Pt is a smoker. He denies fever or any other associated symptoms.  Past Medical History  Diagnosis Date  . Bronchitis   . Scoliosis    History reviewed. No pertinent past surgical history. History reviewed. No pertinent family history. Social History  Substance Use Topics  . Smoking status: Former Smoker -- 0.20 packs/day    Types: Cigarettes  . Smokeless tobacco: Never Used  . Alcohol Use: Yes     Comment: occ    Review of Systems  Constitutional: Negative for  fever.  HENT: Positive for congestion, mouth sores (two) and sinus pressure.   Respiratory: Positive for cough (dry).    Allergies  Review of patient's allergies indicates no known allergies.  Home Medications   Prior to Admission medications   Medication Sig Start Date End Date Taking? Authorizing Provider  amoxicillin-clavulanate (AUGMENTIN) 875-125 MG per tablet Take 1 tablet by mouth 2 (two) times daily. One po bid x 7 days 03/30/14   Terri Piedraourtney Forcucci, PA-C  aspirin 325 MG tablet Take 325 mg by mouth daily.    Historical Provider, MD  Cetirizine HCl (ZYRTEC ALLERGY) 10 MG CAPS Take 1 capsule (10 mg total) by mouth at bedtime. 03/30/14   Courtney Forcucci, PA-C  fexofenadine-pseudoephedrine (ALLEGRA-D) 60-120 MG 12 hr tablet Take 1 tablet by mouth every 12 (twelve) hours. 10/27/15   Burgess AmorJulie Latwan Luchsinger, PA-C  fluticasone (FLONASE) 50 MCG/ACT nasal spray Place 2 sprays into both nostrils daily. 03/30/14   Courtney Forcucci, PA-C  predniSONE (DELTASONE) 10 MG tablet Take 6 tablets day one, 5 tablets day two, 4 tablets day three, 3 tablets day four, 2 tablets day five, then 1 tablet day six 10/31/14   Tammy Triplett, PA-C   BP 144/85 mmHg  Pulse 80  Temp(Src) 98 F (36.7 C) (Oral)  Resp 18  Ht 6\' 3"  (1.905 m)  Wt 72.576 kg  BMI 20.00 kg/m2  SpO2 100% Physical Exam  Constitutional: He is oriented to person, place, and time.  He appears well-developed and well-nourished. No distress.  HENT:  Head: Normocephalic and atraumatic.  Bilateral nasal congestion with right nasal clear rhinorrhea. Two approximate 1 mm raised lesions on hard palate; coloration is the same as surrounding tissue; no drainage. Posterior pharynx is clear without erythema. No postnasal drip. No significant dental abnormalities. Right submandibular adenopathy. Sinuses are non-tender to palpation.  Eyes: Conjunctivae and EOM are normal.  Neck: Neck supple. No tracheal deviation present.  Cardiovascular: Normal rate.    Pulmonary/Chest: Effort normal. No respiratory distress.  Musculoskeletal: Normal range of motion.  Neurological: He is alert and oriented to person, place, and time.  Skin: Skin is warm and dry.  Psychiatric: He has a normal mood and affect. His behavior is normal.  Nursing note and vitals reviewed.   ED Course  Procedures (including critical care time)  DIAGNOSTIC STUDIES: Oxygen Saturation is 100% on RA, normal by my interpretation.    COORDINATION OF CARE: 6:15 PM Discussed treatment plan with pt at bedside and pt agreed to plan.  Labs Review Labs Reviewed - No data to display  Imaging Review No results found. I have personally reviewed and evaluated these images and lab results as part of my medical decision-making.   EKG Interpretation None      MDM   Final diagnoses:  Chronic sinusitis, unspecified location    Pt prescribed allegra D.  Two tiny nodules of mucosa of hard palate of unclear etiology, but not concerning for infection or cancer (pt states this was his biggest concern given he is a smoker).  He was given referral for obtaining pcp who can help with ongoing care if sx persist or worsen.  He was encouraged smoking cessation.  I personally performed the services described in this documentation, which was scribed in my presence. The recorded information has been reviewed and is accurate.   Burgess Amor, PA-C 10/29/15 2253  Donnetta Hutching, MD 10/31/15 [phone]

## 2015-10-27 NOTE — ED Notes (Signed)
Pt states he has had a dry cough and nasal congestion for over a week and in the past 3 days has developed a "sore" on the roof of his mouth which is not painful, but is irritating.

## 2015-10-27 NOTE — Discharge Instructions (Signed)
Sinusitis, Adult  Sinusitis is redness, soreness, and puffiness (inflammation) of the air pockets in the bones of your face (sinuses). The redness, soreness, and puffiness can cause air and mucus to get trapped in your sinuses. This can allow germs to grow and cause an infection.   HOME CARE    Drink enough fluids to keep your pee (urine) clear or pale yellow.   Use a humidifier in your home.   Run a hot shower to create steam in the bathroom. Sit in the bathroom with the door closed. Breathe in the steam 3-4 times a day.   Put a warm, moist washcloth on your face 3-4 times a day, or as told by your doctor.   Use salt water sprays (saline sprays) to wet the thick fluid in your nose. This can help the sinuses drain.   Only take medicine as told by your doctor.  GET HELP RIGHT AWAY IF:    Your pain gets worse.   You have very bad headaches.   You are sick to your stomach (nauseous).   You throw up (vomit).   You are very sleepy (drowsy) all the time.   Your face is puffy (swollen).   Your vision changes.   You have a stiff neck.   You have trouble breathing.  MAKE SURE YOU:    Understand these instructions.   Will watch your condition.   Will get help right away if you are not doing well or get worse.     This information is not intended to replace advice given to you by your health care provider. Make sure you discuss any questions you have with your health care provider.     Document Released: 11/11/2007 Document Revised: 06/15/2014 Document Reviewed: 12/29/2011  Elsevier Interactive Patient Education 2016 [employer]

## 2015-10-31 ENCOUNTER — Encounter (HOSPITAL_COMMUNITY): Payer: Self-pay | Admitting: Emergency Medicine

## 2015-10-31 ENCOUNTER — Emergency Department (HOSPITAL_COMMUNITY)
Admission: EM | Admit: 2015-10-31 | Discharge: 2015-10-31 | Disposition: A | Discharge status: Home / Self Care | Payer: Self-pay | Attending: Emergency Medicine | Admitting: Emergency Medicine

## 2015-10-31 DIAGNOSIS — R42 Dizziness and giddiness: Secondary | ICD-10-CM | POA: Insufficient documentation

## 2015-10-31 DIAGNOSIS — Z7982 Long term (current) use of aspirin: Secondary | ICD-10-CM | POA: Insufficient documentation

## 2015-10-31 DIAGNOSIS — K625 Hemorrhage of anus and rectum: Secondary | ICD-10-CM | POA: Insufficient documentation

## 2015-10-31 DIAGNOSIS — Z87891 Personal history of nicotine dependence: Secondary | ICD-10-CM | POA: Insufficient documentation

## 2015-10-31 LAB — POC OCCULT BLOOD, ED: FECAL OCCULT BLD: NEGATIVE

## 2015-10-31 LAB — COMPREHENSIVE METABOLIC PANEL
ALBUMIN: 4.5 g/dL (ref 3.5–5.0)
ALT: 14 U/L — ABNORMAL LOW (ref 17–63)
ANION GAP: 6 (ref 5–15)
AST: 23 U/L (ref 15–41)
Alkaline Phosphatase: 51 U/L (ref 38–126)
BILIRUBIN TOTAL: 0.4 mg/dL (ref 0.3–1.2)
BUN: 13 mg/dL (ref 6–20)
CO2: 30 mmol/L (ref 22–32)
Calcium: 9.3 mg/dL (ref 8.9–10.3)
Chloride: 103 mmol/L (ref 101–111)
Creatinine, Ser: 1.13 mg/dL (ref 0.61–1.24)
GFR calc Af Amer: >60 mL/min (ref >60)
GFR calc non Af Amer: >60 mL/min (ref >60)
GLUCOSE: 83 mg/dL (ref 65–99)
POTASSIUM: 4 mmol/L (ref 3.5–5.1)
SODIUM: 139 mmol/L (ref 135–145)
TOTAL PROTEIN: 7.7 g/dL (ref 6.5–8.1)

## 2015-10-31 LAB — CBC
HEMATOCRIT: 42 % (ref 39.0–52.0)
HEMOGLOBIN: 13.9 g/dL (ref 13.0–17.0)
MCH: 29.3 pg (ref 26.0–34.0)
MCHC: 33.1 g/dL (ref 30.0–36.0)
MCV: 88.4 fL (ref 78.0–100.0)
Platelets: 223 10*3/uL (ref 150–400)
RBC: 4.75 MIL/uL (ref 4.22–5.81)
RDW: 12.8 % (ref 11.5–15.5)
WBC: 10.6 10*3/uL — AB (ref 4.0–10.5)

## 2015-10-31 MED ORDER — DOCUSATE SODIUM 100 MG PO CAPS: 100.0000 mg | ORAL_CAPSULE | Freq: Two times a day (BID) | ORAL | Status: DC

## 2015-10-31 NOTE — ED Provider Notes (Signed)
CSN: [id number]     Arrival date & time 10/31/15  1759 History   First MD Initiated Contact with Patient 10/31/15 1913     Chief Complaint  Patient presents with  . Rectal Bleeding    Patient is a 25 y.o. male presenting with hematochezia. The history is provided by the patient.  Rectal Bleeding Quality:  Bright red Amount:  Moderate Duration:  1 day Timing:  Intermittent Progression:  Worsening Chronicity:  New Similar prior episodes: no   Relieved by:  None tried Worsened by:  Nothing tried Associated symptoms: abdominal pain and light-headedness   Associated symptoms: no dizziness, no fever, no hematemesis and no vomiting   Risk factors: no anticoagulant use, no liver disease and no NSAID use   Risk factors comment:  Drinks one beer/day patient reports over the past day he has had 4 bowel movements that had blood mixed in stool and blood on toilet paper He is not vomiting He reports he strains with defecation but it is not painful He has never had this before   Past Medical History  Diagnosis Date  . Bronchitis   . Scoliosis    History reviewed. No pertinent past surgical history. History reviewed. No pertinent family history. Social History  Substance Use Topics  . Smoking status: Former Smoker -- 0.20 packs/day    Types: Cigarettes  . Smokeless tobacco: Never Used  . Alcohol Use: Yes     Comment: occ    Review of Systems  Constitutional: Negative for fever.  Gastrointestinal: Positive for abdominal pain, blood in stool and hematochezia. Negative for vomiting and hematemesis.  Neurological: Positive for light-headedness. Negative for dizziness.  All other systems reviewed and are negative.     Allergies  Review of patient's allergies indicates no known allergies.  Home Medications   Prior to Admission medications   Medication Sig Start Date End Date Taking? Authorizing Provider  amoxicillin-clavulanate (AUGMENTIN) 875-125 MG per tablet Take 1 tablet by  mouth 2 (two) times daily. One po bid x 7 days 03/30/14   Terri Piedra, PA-C  aspirin 325 MG tablet Take 325 mg by mouth daily.    Historical Provider, MD  Cetirizine HCl (ZYRTEC ALLERGY) 10 MG CAPS Take 1 capsule (10 mg total) by mouth at bedtime. 03/30/14   Courtney Forcucci, PA-C  fexofenadine-pseudoephedrine (ALLEGRA-D) 60-120 MG 12 hr tablet Take 1 tablet by mouth every 12 (twelve) hours. 10/27/15   Burgess Amor, PA-C  fluticasone (FLONASE) 50 MCG/ACT nasal spray Place 2 sprays into both nostrils daily. 03/30/14   Courtney Forcucci, PA-C  predniSONE (DELTASONE) 10 MG tablet Take 6 tablets day one, 5 tablets day two, 4 tablets day three, 3 tablets day four, 2 tablets day five, then 1 tablet day six 10/31/14   Tammy Triplett, PA-C   BP 148/89 mmHg  Pulse 89  Temp(Src) 98.5 F (36.9 C) (Oral)  Resp 18  Ht  (1.93 m)  Wt 72.576 kg  BMI 19.48 kg/m2  SpO2 100% Physical Exam CONSTITUTIONAL: Well developed/well nourished HEAD: Normocephalic/atraumatic EYES: EOMI ENMT: Mucous membranes moist NECK: supple no meningeal signs SPINE/BACK:entire spine nontender CV: S1/S2 noted, no murmurs/rubs/gallops noted LUNGS: Lungs are clear to auscultation bilaterally, no apparent distress ABDOMEN: soft, nontender, no rebound or guarding, bowel sounds noted throughout abdomen Rectal - stool brown, no external hemorrhoids, no mass/abscess, no melena or blood, hemoccult negative, nurse Ronnie present for exam NEURO: Pt is awake/alert/appropriate, moves all extremitiesx4.  No facial droop.   EXTREMITIES: pulses normal/equal, full  ROM SKIN: warm, color normal PSYCH: no abnormalities of mood noted, alert and oriented to situation  ED Course  Procedures  Labs Review Labs Reviewed  COMPREHENSIVE METABOLIC PANEL - Abnormal; Notable for the following:    ALT 14 (*)    All other components within normal limits  CBC - Abnormal; Notable for the following:    WBC 10.6 (*)    All other components within  normal limits  POC OCCULT BLOOD, ED    Labs reassuring No signs of acute GI bleed at this time Will encourage stool softerners Avoid NSAIDs Discussed strict return precautions Referred to GI  MDM   Final diagnoses:  Rectal bleeding    Nursing notes including past medical history and social history reviewed and considered in documentation Labs/vital reviewed myself and considered during evaluation     Zadie Rhineonald Daanish Copes, MD 10/31/15 2027

## 2015-10-31 NOTE — ED Notes (Signed)
Pt states he began having blood in stool yesterday.  States dark red and turns water dark.  2 episodes today and 2 yesterday.  States abdominal cramping.

## 2015-10-31 NOTE — Discharge Instructions (Signed)
Gastrointestinal Bleeding °Gastrointestinal bleeding is bleeding somewhere along the path that food travels through the body (digestive tract). This path is anywhere between the mouth and the opening of the butt (anus). You may have blood in your throw up (vomit) or in your poop (stools). If there is a lot of bleeding, you may need to stay in the hospital. °HOME CARE °· Only take medicine as told by your doctor. °· Eat foods with fiber such as whole grains, fruits, and vegetables. You can also try eating 1 to 3 prunes a day. °· Drink enough fluids to keep your pee (urine) clear or pale yellow. °GET HELP RIGHT AWAY IF:  °· Your bleeding gets worse. °· You feel dizzy, weak, or you pass out (faint). °· You have bad cramps in your back or belly (abdomen). °· You have large blood clumps (clots) in your poop. °· Your problems are getting worse. °MAKE SURE YOU:  °· Understand these instructions. °· Will watch your condition. °· Will get help right away if you are not doing well or get worse. °  °This information is not intended to replace advice given to you by your health care provider. Make sure you discuss any questions you have with your health care provider. °  °Document Released: 03/03/2008 Document Revised: 05/11/2012 Document Reviewed: 11/12/2014 °Elsevier Interactive Patient Education ©2016 Elsevier Inc. ° °

## 2016-07-14 ENCOUNTER — Emergency Department (HOSPITAL_COMMUNITY)
Admission: EM | Admit: 2016-07-14 | Discharge: 2016-07-14 | Disposition: A | Discharge status: Home / Self Care | Payer: Self-pay | Attending: Emergency Medicine | Admitting: Emergency Medicine

## 2016-07-14 ENCOUNTER — Encounter (HOSPITAL_COMMUNITY): Payer: Self-pay | Admitting: Emergency Medicine

## 2016-07-14 DIAGNOSIS — J339 Nasal polyp, unspecified: Secondary | ICD-10-CM | POA: Insufficient documentation

## 2016-07-14 DIAGNOSIS — Z87891 Personal history of nicotine dependence: Secondary | ICD-10-CM | POA: Insufficient documentation

## 2016-07-14 MED ORDER — METHYLPREDNISOLONE 4 MG PO TBPK: ORAL_TABLET | ORAL | 0 refills | Status: DC

## 2016-07-14 MED ORDER — AMOXICILLIN-POT CLAVULANATE 875-125 MG PO TABS: 1.0000 | ORAL_TABLET | Freq: Two times a day (BID) | ORAL | 0 refills | Status: DC

## 2016-07-14 NOTE — ED Triage Notes (Signed)
Patient is complaining of nasal congestion and sinus pressure "for a while."

## 2016-07-14 NOTE — ED Provider Notes (Signed)
[facility]-EMERGENCY DEPT Provider Note   CSN: [id number] Arrival date & time: 07/14/16  [id number]   By signing my name below, I, Juan Woodward, attest that this documentation has been prepared under the direction and in the presence of  Demetrios LollKenneth Toree Edling, PA-C. Electronically Signed: Clovis PuAvnee Woodward, ED Scribe. 07/14/16. 10:27 AM.   History   Chief Complaint Chief Complaint  Patient presents with  . Nasal Congestion   The history is provided by the patient. No language interpreter was used.   HPI Comments:  Juan Woodward is a 26 y.o. male who presents to the Emergency Department complaining of nasal congestion onset since he was a kid. He also reports clear to yellow/waxy rhinorrhea, sinus pressure which is worse on the right side, headaches and intermittent nose bleeds. Pt has taken antibiotics with no significant relief. Reports difficulty breathing from right nostril for "yeasrs". States he will get steroids and antibiotics from providers that help intermittently and then symptoms return. Pt denies fevers, cough, sore throat, ha, dizziness, lightheaded, or visual changes at this time. He is not followed by an ENT. No other associated symptoms noted.  Past Medical History:  Diagnosis Date  . Bronchitis   . Scoliosis     There are no active problems to display for this patient.   History reviewed. No pertinent surgical history.     Home Medications    Prior to Admission medications   Medication Sig Start Date End Date Taking? Authorizing Provider  docusate sodium (COLACE) 100 MG capsule Take 1 capsule (100 mg total) by mouth every 12 (twelve) hours. 10/31/15   Zadie Rhineonald Wickline, MD    Family History No family history on file.  Social History Social History  Substance Use Topics  . Smoking status: Former Smoker    Packs/day: 0.20    Types: Cigarettes  . Smokeless tobacco: Never Used  . Alcohol use Yes     Comment: occ     Allergies   Patient has no known  allergies.   Review of Systems Review of Systems  Constitutional: Negative for fever.  HENT: Positive for congestion, nosebleeds, rhinorrhea and sinus pressure. Negative for sore throat.   Respiratory: Negative for cough.   All other systems reviewed and are negative.  Physical Exam Updated Vital Signs BP 145/100 (BP Location: Left Arm)   Pulse 90   Temp 97.5 F (36.4 C) (Oral)   Resp 16   Ht 6\' 3"  (1.905 m)   Wt 160 lb (72.6 kg)   SpO2 98%   BMI 20.00 kg/m   Physical Exam  Constitutional: He is oriented to person, place, and time. He appears well-developed and well-nourished. No distress.  HENT:  Head: Normocephalic and atraumatic.  Right Ear: Tympanic membrane, external ear and ear canal normal.  Left Ear: Tympanic membrane, external ear and ear canal normal.  Nose: Mucosal edema and rhinorrhea present. No septal deviation or nasal septal hematoma. No epistaxis. Right sinus exhibits maxillary sinus tenderness. Right sinus exhibits no frontal sinus tenderness. Left sinus exhibits maxillary sinus tenderness. Left sinus exhibits no frontal sinus tenderness.  Mouth/Throat: Oropharynx is clear and moist.  Large anterior clear right nasal polyp.  No epistaxis. Clear to yellow discharge. Nostrils ar patent but more stopped up on the right.  Eyes: Conjunctivae and EOM are normal. Pupils are equal, round, and reactive to light. Right eye exhibits no discharge. Left eye exhibits no discharge.  Neck: Normal range of motion. Neck supple.  Cardiovascular: Normal rate.   Pulmonary/Chest: Effort  normal.  Abdominal: He exhibits no distension.  Lymphadenopathy:    He has no cervical adenopathy.  Neurological: He is alert and oriented to person, place, and time.  Skin: Skin is warm and dry. Capillary refill takes less than 2 seconds.  Psychiatric: He has a normal mood and affect.  Nursing note and vitals reviewed.   ED Treatments / Results  DIAGNOSTIC STUDIES:  Oxygen Saturation is  98% on RA, normal by my interpretation.    COORDINATION OF CARE:  10:24 AM Discussed treatment plan with pt at bedside and pt agreed to plan.  Labs (all labs ordered are listed, but only abnormal results are displayed) Labs Reviewed - No data to display  EKG  EKG Interpretation None       Radiology No results found.  Procedures Procedures (including critical care time)  Medications Ordered in ED Medications - No data to display   Initial Impression / Assessment and Plan / ED Course  I have reviewed the triage vital signs and the nursing notes.  Pertinent labs & imaging results that were available during my care of the patient were reviewed by me and considered in my medical decision making (see chart for details).     Patient resents to the ED with complaint of chronic sinusitis and rhinorrhea. Patient does have an anterior nasal polyp noted on the right side. Nares are patent however he does have difficulty breathing on the right side. Mild tenderness over the right maxillary sinus. Patient states he's been seen multiple times for same. He is prescribed antibiotics and steroids with temporary relief. However symptoms return. Patient never followed up with an ENT doctor. He denies any fever. Denies any lightheadedness or dizziness. No septal hematoma noted. Nasal polyp is clear. Clear to yellow rhinorrhea is noted. Oropharynx is clear. Patient on any difficulty breathing. Vital signs are stable. Patient is nontoxic appearing. No other complaints at this time. Will place patient on steroid taper and antibiotics. Encouraged to uses Flonase. I have encouraged to follow up with an ENT doctor. Will likely need surgery to remove polyp as he is not outpatient treatment. Patient does not appear to be any acute distress. He is agreeable to above plan. Pt is hemodynamically stable, in NAD, & able to ambulate in the ED. Pain has been managed & has no complaints prior to dc. Pt is comfortable  with above plan and is stable for discharge at this time. All questions were answered prior to disposition. Strict return precautions for f/u to the ED were discussed.  Final Clinical Impressions(s) / ED Diagnoses   Final diagnoses:  Nasal polyp    New Prescriptions New Prescriptions   AMOXICILLIN-CLAVULANATE (AUGMENTIN) 875-125 MG TABLET    Take 1 tablet by mouth 2 (two) times daily.   METHYLPREDNISOLONE (MEDROL DOSEPAK) 4 MG TBPK TABLET    Take as prescribed  I personally performed the services described in this documentation, which was scribed in my presence. The recorded information has been reviewed and is accurate.      Rise Mu, PA-C 07/14/16 1051    Gerhard Munch, MD 07/16/16 1425

## 2016-07-14 NOTE — Discharge Instructions (Signed)
You need to follow up with ENT to have your nose looked at. You may need surgery since other treatment has failed. Please take the steroid taper as prescribed. Continue to use your Flonase. I would also use nasal saline washes to keep your nose moist. I'm also giving a prescription for antibiotics. Please take as prescribed. Please follow-up with ENT. Return to the ED if he developed worsening symptoms.

## 2016-07-14 NOTE — ED Notes (Signed)
Bed: WTR6 Expected date:  Expected time:  Means of arrival:  Comments: 

## 2016-07-20 ENCOUNTER — Ambulatory Visit (INDEPENDENT_AMBULATORY_CARE_PROVIDER_SITE_OTHER): Payer: Self-pay | Admitting: Pulmonary Disease

## 2016-07-20 ENCOUNTER — Encounter: Payer: Self-pay | Admitting: Pulmonary Disease

## 2016-07-20 VITALS — BP 143/90 | HR 73 | Temp 97.8°F | Ht 75.0 in | Wt 170.3 lb

## 2016-07-20 DIAGNOSIS — R030 Elevated blood-pressure reading, without diagnosis of hypertension: Secondary | ICD-10-CM | POA: Insufficient documentation

## 2016-07-20 DIAGNOSIS — J339 Nasal polyp, unspecified: Secondary | ICD-10-CM | POA: Insufficient documentation

## 2016-07-20 DIAGNOSIS — Z23 Encounter for immunization: Secondary | ICD-10-CM

## 2016-07-20 DIAGNOSIS — F17211 Nicotine dependence, cigarettes, in remission: Secondary | ICD-10-CM

## 2016-07-20 DIAGNOSIS — H6123 Impacted cerumen, bilateral: Secondary | ICD-10-CM

## 2016-07-20 DIAGNOSIS — Z0000 Encounter for general adult medical examination without abnormal findings: Secondary | ICD-10-CM

## 2016-07-20 DIAGNOSIS — Z833 Family history of diabetes mellitus: Secondary | ICD-10-CM

## 2016-07-20 DIAGNOSIS — H6120 Impacted cerumen, unspecified ear: Secondary | ICD-10-CM | POA: Insufficient documentation

## 2016-07-20 MED ORDER — MONTELUKAST SODIUM 10 MG PO TABS: 10.0000 mg | ORAL_TABLET | Freq: Every day | ORAL | 2 refills | Status: DC

## 2016-07-20 MED ORDER — TETANUS-DIPHTH-ACELL PERTUSSIS 5-2.5-18.5 LF-MCG/0.5 IM SUSP
0.5000 mL | Freq: Once | INTRAMUSCULAR | Status: AC
  Administered 2016-07-20: 0.5 mL via INTRAMUSCULAR

## 2016-07-20 MED ORDER — LORATADINE 10 MG PO TABS: 10.0000 mg | ORAL_TABLET | Freq: Every day | ORAL | 1 refills | Status: DC

## 2016-07-20 MED ORDER — BUDESONIDE 32 MCG/ACT NA SUSP: 2.0000 | Freq: Every day | NASAL | 1 refills | Status: DC

## 2016-07-20 NOTE — Addendum Note (Signed)
Addended by: Angelina OkHERBIN, Cybil Sen[place] F on: 07/20/2016 11:27 AM   Modules accepted: Orders

## 2016-07-20 NOTE — Assessment & Plan Note (Signed)
Assessment: Possible chronic allergic rhinitis or recurrent sinusitis.  Plan: Budesonide nasal spray daily Loratadine 10mg  daily Singulair 10mg  daily Saline rinses daily Follow up in 4-6 weeks Referral to ENT after he meets with financial counseler

## 2016-07-20 NOTE — Assessment & Plan Note (Signed)
BP 143/90. Could be elevated from sinus problems. No history of hypertension. Recheck at follow up in 4-6 weeks. If still elevated, would consider low dose HCTZ as he has had other readings in the 140s systolic in our system.

## 2016-07-20 NOTE — Progress Notes (Signed)
   CC: nasal congestion  HPI:  [name] is a 26 y.o. man with history of sinus problems presenting for follow up of nasal congestion.   He has had problems with his sinuses and nasal congestion for years. Recently was seen in ED on 2/6 and was prescribed oral steroids and Augmentin. He has completed both with minimal relief. He has dark yellow, thick nasal drainage. He has cough. He has tried nasal steroid (not daily), Mucinex, saline spray, Neti pot. He uses Tylenol prn for pain.   Past Medical History:  Diagnosis Date  . Allergy   . Scoliosis    No past surgical history on file.   Family History  Problem Relation Age of Onset  . Diabetes Maternal Grandmother   . Heart disease Neg Hx     Social History   Social History  . Marital status: Married    Spouse name: N/A  . Number of children: N/A  . Years of education: N/A   Social History Main Topics  . Smoking status: Former Smoker    Packs/day: 0.20    Types: Cigarettes    Quit date: 11/07/2015  . Smokeless tobacco: Never Used  . Alcohol use 3.6 oz/week    6 Cans of beer per week     Comment: 10-12 cans of beer on the weekend every 2-3 weeks  . Drug use: No  . Sexual activity: Yes    Birth control/ protection: Condom   Other Topics Concern  . None   Social History Narrative  . None    Review of Systems:   Constitutional: no fevers/chills Eyes: no vision changes Ears, nose, mouth, throat, and face: +occ cough Respiratory: +occ mild shortness of breath Cardiovascular: no palpitations Gastrointestinal: no nausea/vomiting, no abdominal pain, no constipation, no diarrhea Genitourinary: no dysuria, no hematuria Integument: no rash Hematologic/lymphatic: no bleeding/bruising, no edema Musculoskeletal: no arthralgias, no myalgias Neurological: no paresthesias, no weakness   Physical Exam:  Vitals:   07/20/16 0947  BP: (!) 143/90  Pulse: 73  Temp: 97.8 F (36.6 C)  TempSrc: Oral  SpO2:  100%  Weight: 170 lb 4.8 oz (77.2 kg)  Height: 6\' 3"  (1.905 m)   General Apperance: NAD HEENT: Normocephalic, atraumatic, anicteric sclera, cerumen impaction bilateral ears, nasal polyp visible from right nare, erythema of nasal mucosa Neck: Supple, trachea midline Lungs: Clear to auscultation bilaterally. No wheezes, rhonchi or rales. Breathing comfortably on room air Heart: Regular rate and rhythm, no murmur/rub/gallop Abdomen: Soft, nontender, nondistended, no rebound/guarding Extremities: Warm and well perfused, no edema Skin: No rashes or lesions Neurologic: Alert and interactive. No gross deficits.  Assessment & Plan:   See Encounters Tab for problem based charting.  Patient discussed with Dr. Cleda DaubE. Hoffman

## 2016-07-20 NOTE — Patient Instructions (Addendum)
1. Use the Neti Pot to rinse your sinuses daily 2. Use budesonide nasal spray 2 sprays daily 3. Take Loratadine 10mg  daily 4. Take Montelukast 10mg  daily  Follow up in 4-6 weeks  You may use over the counter ear wax removal drops. Look for the ingredient carbamide peroxide.   NASAL STEROID USE INSTRUCTIONS  Step 1. Prepare the nose. Blow the nose before administering the drug.  Step 2. Prime and activate the delivery device as recommended by the manufacturer.  Step 3. Position the head by tilting the head forward.  Step 4. Insert the tip of the applicator gently, avoiding contact with the septum.  Step 5. Aim the applicator tip about 45 from the floor of the nose and direct it at the outer corner of the eye on the same side to avoid traumatizing or spraying the septum.  Step 6. Close the other nostril gently with a finger.   Step 7. Sniff or inhale gently while delivering the drug.

## 2016-07-20 NOTE — Assessment & Plan Note (Signed)
Bilateral cerumen impaction. Irrigation today. Instructions given for over the counter cerumenolytics. Avoid cotton tipped applicators for cleaning ears.

## 2016-07-20 NOTE — Assessment & Plan Note (Signed)
Influenza vaccine and TDAP administered

## 2016-07-21 NOTE — Progress Notes (Signed)
[facility] Clinic Attending  Case discussed with Dr. Krall at the time of the visit.  We reviewed the resident's history and exam and pertinent patient test results.  I agree with the assessment, diagnosis, and plan of care documented in the resident's note.  

## 2017-01-25 ENCOUNTER — Emergency Department (HOSPITAL_COMMUNITY)
Admission: EM | Admit: 2017-01-25 | Discharge: 2017-01-25 | Disposition: A | Discharge status: Home / Self Care | Payer: Self-pay | Attending: Emergency Medicine | Admitting: Emergency Medicine

## 2017-01-25 ENCOUNTER — Encounter (HOSPITAL_COMMUNITY): Payer: Self-pay

## 2017-01-25 DIAGNOSIS — H66001 Acute suppurative otitis media without spontaneous rupture of ear drum, right ear: Secondary | ICD-10-CM | POA: Insufficient documentation

## 2017-01-25 DIAGNOSIS — J339 Nasal polyp, unspecified: Secondary | ICD-10-CM

## 2017-01-25 DIAGNOSIS — Z79899 Other long term (current) drug therapy: Secondary | ICD-10-CM | POA: Insufficient documentation

## 2017-01-25 DIAGNOSIS — F17210 Nicotine dependence, cigarettes, uncomplicated: Secondary | ICD-10-CM | POA: Insufficient documentation

## 2017-01-25 DIAGNOSIS — R0981 Nasal congestion: Secondary | ICD-10-CM | POA: Insufficient documentation

## 2017-01-25 MED ORDER — AMOXICILLIN 500 MG PO CAPS: 500.0000 mg | ORAL_CAPSULE | Freq: Three times a day (TID) | ORAL | 0 refills | Status: AC

## 2017-01-25 MED ORDER — FLUTICASONE PROPIONATE 50 MCG/ACT NA SUSP: 1.0000 | Freq: Every day | NASAL | 0 refills | Status: DC

## 2017-01-25 NOTE — Discharge Instructions (Signed)
Take your entire course of antibiotics.  You should slowly wean yourself off of the nasal decongestant spray you are currently using as this long term can make congestion worse.

## 2017-01-25 NOTE — ED Triage Notes (Signed)
Pt reports that he woke up right ear pain 3 am. Pt reports he has had nasal congestion and can not breathe out of right nare. Reports blood noted in drainage

## 2017-01-25 NOTE — ED Provider Notes (Signed)
[facility]EMERGENCY DEPT Provider Note   CSN: [id number] Arrival date & time: 01/25/17  6378     History   Chief Complaint Chief Complaint  Patient presents with  . Otalgia    HPI Juan Woodward is a 26 y.o. male with a known right nasal polyp here for problems with chronic nasal congestion and new onset of right ear pain which woke him from sleep early this morning. He denies drainage from the ear but does have decreased hearing acuity.  Reports chronic nasal congestion, discharge, sometimes bloody.  He denies fevers but has felt cold today.  He reports chronic pressure around his nose.  Denies sob, sore throat, headache.  He is currently using an otc nasal spray which is not effective.  The history is provided by the patient.    Past Medical History:  Diagnosis Date  . Allergy   . Scoliosis     Patient Active Problem List   Diagnosis Date Noted  . Nasal polyp 07/20/2016  . Cerumen impaction 07/20/2016  . Healthcare maintenance 07/20/2016  . Elevated blood pressure reading 07/20/2016    History reviewed. No pertinent surgical history.     Home Medications    Prior to Admission medications   Medication Sig Start Date End Date Taking? Authorizing Provider  amoxicillin (AMOXIL) 500 MG capsule Take 1 capsule (500 mg total) by mouth 3 (three) times daily. 01/25/17 02/04/17  Burgess Amor, PA-C  budesonide (RHINOCORT AQUA) 32 MCG/ACT nasal spray Place 2 sprays into both nostrils daily. 07/20/16   Lora Paula, MD  fluticasone (FLONASE) 50 MCG/ACT nasal spray Place 1 spray into both nostrils daily. 01/25/17   Burgess Amor, PA-C  loratadine (CLARITIN) 10 MG tablet Take 1 tablet (10 mg total) by mouth daily. 07/20/16   Lora Paula, MD  montelukast (SINGULAIR) 10 MG tablet Take 1 tablet (10 mg total) by mouth daily. 07/20/16 07/20/17  Lora Paula, MD    Family History Family History  Problem Relation Age of Onset  . Diabetes Maternal Grandmother   . Heart  disease Neg Hx     Social History Social History  Substance Use Topics  . Smoking status: Current Some Day Smoker    Packs/day: 0.20    Types: Cigarettes    Last attempt to quit: 11/07/2015  . Smokeless tobacco: Never Used  . Alcohol use 3.6 oz/week    6 Cans of beer per week     Comment: 10-12 cans of beer on the weekend every 2-3 weeks     Allergies   Patient has no known allergies.   Review of Systems Review of Systems  Constitutional: Positive for chills. Negative for fever.  HENT: Positive for congestion, ear pain and rhinorrhea. Negative for ear discharge and sore throat.   Eyes: Negative.   Respiratory: Negative for chest tightness and shortness of breath.   Cardiovascular: Negative for chest pain.  Gastrointestinal: Negative for abdominal pain and nausea.  Genitourinary: Negative.   Musculoskeletal: Negative for arthralgias, joint swelling and neck pain.  Skin: Negative.  Negative for rash and wound.  Neurological: Negative for dizziness, weakness, light-headedness, numbness and headaches.  Psychiatric/Behavioral: Negative.      Physical Exam Updated Vital Signs BP (!) 140/102 (BP Location: Left Arm)   Pulse 82   Temp 97.6 F (36.4 C) (Oral)   Resp 16   Ht 6\' 3"  (1.905 m)   Wt 79.4 kg (175 lb)   SpO2 100%   BMI 21.87 kg/m   Physical  Exam  Constitutional: He is oriented to person, place, and time. He appears well-developed and well-nourished.  HENT:  Head: Normocephalic and atraumatic.  Right Ear: Ear canal normal. Tympanic membrane is erythematous and bulging. Tympanic membrane is not perforated.  Left Ear: Tympanic membrane and ear canal normal.  Nose: Mucosal edema and rhinorrhea present. No nasal septal hematoma.  Mouth/Throat: Uvula is midline, oropharynx is clear and moist and mucous membranes are normal. No oropharyngeal exudate, posterior oropharyngeal edema, posterior oropharyngeal erythema or tonsillar abscesses.  Right nasal polyp noted.   Nasal congestion, severe right nostril.  Eyes: Conjunctivae are normal.  Cardiovascular: Normal rate and normal heart sounds.   Pulmonary/Chest: Effort normal. No respiratory distress. He has no wheezes. He has no rales.  Musculoskeletal: Normal range of motion.  Neurological: He is alert and oriented to person, place, and time.  Skin: Skin is warm and dry. No rash noted.  Psychiatric: He has a normal mood and affect.     ED Treatments / Results  Labs (all labs ordered are listed, but only abnormal results are displayed) Labs Reviewed - No data to display  EKG  EKG Interpretation None       Radiology No results found.  Procedures Procedures (including critical care time)  Medications Ordered in ED Medications - No data to display   Initial Impression / Assessment and Plan / ED Course  I have reviewed the triage vital signs and the nursing notes.  Pertinent labs & imaging results that were available during my care of the patient were reviewed by me and considered in my medical decision making (see chart for details).     Amoxil, flonase nasal spray, coupon given.  Pt advised he needs ENT eval and probable surgery to remove this polyp which I suspect is the source of his chronic sx. Pt understands, referrals given.  Final Clinical Impressions(s) / ED Diagnoses   Final diagnoses:  Acute suppurative otitis media of right ear without spontaneous rupture of tympanic membrane, recurrence not specified  Nasal polyp  Chronic nasal congestion    New Prescriptions New Prescriptions   AMOXICILLIN (AMOXIL) 500 MG CAPSULE    Take 1 capsule (500 mg total) by mouth 3 (three) times daily.   FLUTICASONE (FLONASE) 50 MCG/ACT NASAL SPRAY    Place 1 spray into both nostrils daily.     Burgess Amor, PA-C 01/25/17 1610    Benjiman Core, MD 01/25/17 [phone]

## 2017-03-30 ENCOUNTER — Encounter (HOSPITAL_COMMUNITY): Payer: Self-pay | Admitting: *Deleted

## 2017-03-30 ENCOUNTER — Emergency Department (HOSPITAL_COMMUNITY)
Admission: EM | Admit: 2017-03-30 | Discharge: 2017-03-30 | Disposition: A | Discharge status: Home / Self Care | Payer: Self-pay | Attending: Emergency Medicine | Admitting: Emergency Medicine

## 2017-03-30 DIAGNOSIS — F17210 Nicotine dependence, cigarettes, uncomplicated: Secondary | ICD-10-CM | POA: Insufficient documentation

## 2017-03-30 DIAGNOSIS — J320 Chronic maxillary sinusitis: Secondary | ICD-10-CM

## 2017-03-30 DIAGNOSIS — J0100 Acute maxillary sinusitis, unspecified: Secondary | ICD-10-CM | POA: Insufficient documentation

## 2017-03-30 MED ORDER — DOXYCYCLINE HYCLATE 100 MG PO CAPS: 100.0000 mg | ORAL_CAPSULE | Freq: Two times a day (BID) | ORAL | 0 refills | Status: DC

## 2017-03-30 MED ORDER — NAPROXEN 375 MG PO TABS: 375.0000 mg | ORAL_TABLET | Freq: Two times a day (BID) | ORAL | 0 refills | Status: DC

## 2017-03-30 MED ORDER — BUDESONIDE 32 MCG/ACT NA SUSP: 1.0000 | Freq: Every day | NASAL | 1 refills | Status: DC

## 2017-03-30 MED ORDER — SALINE SPRAY 0.65 % NA SOLN: 1.0000 | NASAL | 0 refills | Status: DC | PRN

## 2017-03-30 NOTE — ED Triage Notes (Signed)
Pt states that he has been having problems with nasal congestion for "years" but it became worse over the past 3 days along with fatigue, headache.

## 2017-03-30 NOTE — ED Provider Notes (Signed)
[facility] EMERGENCY DEPARTMENT Provider Note   CSN: [id number] Arrival date & time: 03/30/17  1928     History   Chief Complaint Chief Complaint  Patient presents with  . Nasal Congestion    HPI Juan Woodward is a 26 y.o. male.  Patient reports nasal congestion and inability to breath through his nose that has been ongoing since he was a teenager, but has worsened over the last several days. Denies fever/chills. Rare, non-productive cough.    URI   This is a chronic problem. The current episode started more than 2 days ago. There has been no fever. Associated symptoms include headaches.    Past Medical History:  Diagnosis Date  . Allergy   . Scoliosis     Patient Active Problem List   Diagnosis Date Noted  . Nasal polyp 07/20/2016  . Cerumen impaction 07/20/2016  . Healthcare maintenance 07/20/2016  . Elevated blood pressure reading 07/20/2016    History reviewed. No pertinent surgical history.     Home Medications    Prior to Admission medications   Medication Sig Start Date End Date Taking? Authorizing Provider  budesonide (RHINOCORT AQUA) 32 MCG/ACT nasal spray Place 2 sprays into both nostrils daily. 07/20/16   Lora PaulaKrall, Jennifer T, MD  fluticasone (FLONASE) 50 MCG/ACT nasal spray Place 1 spray into both nostrils daily. 01/25/17   Burgess AmorIdol, Julie, PA-C  loratadine (CLARITIN) 10 MG tablet Take 1 tablet (10 mg total) by mouth daily. 07/20/16   Lora PaulaKrall, Jennifer T, MD  montelukast (SINGULAIR) 10 MG tablet Take 1 tablet (10 mg total) by mouth daily. 07/20/16 07/20/17  Lora PaulaKrall, Jennifer T, MD    Family History Family History  Problem Relation Age of Onset  . Diabetes Maternal Grandmother   . Heart disease Neg Hx     Social History Social History  Substance Use Topics  . Smoking status: Current Some Day Smoker    Packs/day: 0.20    Types: Cigarettes    Last attempt to quit: 11/07/2015  . Smokeless tobacco: Never Used  . Alcohol use 3.6 oz/week    6 Cans  of beer per week     Comment: 10-12 cans of beer on the weekend every 2-3 weeks     Allergies   Patient has no known allergies.   Review of Systems Review of Systems  HENT: Positive for facial swelling.        Nasal congestion  Neurological: Positive for headaches.  All other systems reviewed and are negative.    Physical Exam Updated Vital Signs BP (!) 134/100 (BP Location: Left Arm)   Pulse 84   Temp 99 F (37.2 C) (Temporal)   Resp 18   Ht 6\' 3"  (1.905 m)   Wt 83 kg (183 lb)   SpO2 99%   BMI 22.87 kg/m   Physical Exam  Constitutional: He appears well-developed and well-nourished.  HENT:  Head: Normocephalic and atraumatic.  Nose: Mucosal edema and sinus tenderness present. Right sinus exhibits maxillary sinus tenderness. Left sinus exhibits maxillary sinus tenderness.  Mouth/Throat: Uvula is midline and oropharynx is clear and moist. No oropharyngeal exudate.  Eyes: Conjunctivae are normal.  Neck: Neck supple.  Cardiovascular: Normal rate and regular rhythm.   No murmur heard. Pulmonary/Chest: Effort normal and breath sounds normal. No respiratory distress.  Abdominal: Soft. There is no tenderness.  Musculoskeletal: Normal range of motion. He exhibits no edema.  Neurological: He is alert.  Skin: Skin is warm and dry.  Psychiatric: He has a  normal mood and affect.  Nursing note and vitals reviewed.    ED Treatments / Results  Labs (all labs ordered are listed, but only abnormal results are displayed) Labs Reviewed - No data to display  EKG  EKG Interpretation None       Radiology No results found.  Procedures Procedures (including critical care time)  Medications Ordered in ED Medications - No data to display   Initial Impression / Assessment and Plan / ED Course  I have reviewed the triage vital signs and the nursing notes.  Pertinent labs & imaging results that were available during my care of the patient were reviewed by me and  considered in my medical decision making (see chart for details).     Pt symptoms and exam consistent with maxillary sinusitis. Pt will be discharged with symptomatic treatment instructions, as well as antibiotic and anti-inflammatory. ENT follow-up.  Discussed return precautions.  Pt is hemodynamically stable & in NAD prior to discharge.  Final Clinical Impressions(s) / ED Diagnoses   Final diagnoses:  Maxillary sinusitis, unspecified chronicity    New Prescriptions Discharge Medication List as of 03/30/2017  8:26 PM    START taking these medications   Details  !! budesonide (RHINOCORT AQUA) 32 MCG/ACT nasal spray Place 1 spray into both nostrils daily., Starting Tue 03/30/2017, Print    doxycycline (VIBRAMYCIN) 100 MG capsule Take 1 capsule (100 mg total) by mouth 2 (two) times daily. One po bid x 7 days, Starting Tue 03/30/2017, Print    naproxen (NAPROSYN) 375 MG tablet Take 1 tablet (375 mg total) by mouth 2 (two) times daily., Starting Tue 03/30/2017, Print    sodium chloride (OCEAN) 0.65 % SOLN nasal spray Place 1 spray into both nostrils as needed for congestion., Starting Tue 03/30/2017, Print     !! - Potential duplicate medications found. Please discuss with provider.       Felicie Morn, NP 03/31/17 Riley Kill    Raeford Razor, MD 04/06/17 [phone]

## 2017-06-18 ENCOUNTER — Other Ambulatory Visit: Payer: Self-pay

## 2017-06-18 ENCOUNTER — Encounter (HOSPITAL_COMMUNITY): Payer: Self-pay | Admitting: Emergency Medicine

## 2017-06-18 ENCOUNTER — Emergency Department (HOSPITAL_COMMUNITY)
Admission: EM | Admit: 2017-06-18 | Discharge: 2017-06-18 | Disposition: A | Discharge status: Home / Self Care | Payer: Self-pay | Attending: Emergency Medicine | Admitting: Emergency Medicine

## 2017-06-18 DIAGNOSIS — J3489 Other specified disorders of nose and nasal sinuses: Secondary | ICD-10-CM | POA: Insufficient documentation

## 2017-06-18 DIAGNOSIS — R05 Cough: Secondary | ICD-10-CM | POA: Insufficient documentation

## 2017-06-18 DIAGNOSIS — Z79899 Other long term (current) drug therapy: Secondary | ICD-10-CM | POA: Insufficient documentation

## 2017-06-18 DIAGNOSIS — R0981 Nasal congestion: Secondary | ICD-10-CM | POA: Insufficient documentation

## 2017-06-18 DIAGNOSIS — J029 Acute pharyngitis, unspecified: Secondary | ICD-10-CM | POA: Insufficient documentation

## 2017-06-18 DIAGNOSIS — J339 Nasal polyp, unspecified: Secondary | ICD-10-CM | POA: Insufficient documentation

## 2017-06-18 DIAGNOSIS — F17210 Nicotine dependence, cigarettes, uncomplicated: Secondary | ICD-10-CM | POA: Insufficient documentation

## 2017-06-18 MED ORDER — MAGIC MOUTHWASH W/LIDOCAINE: 5.0000 mL | Freq: Three times a day (TID) | ORAL | 0 refills | Status: DC | PRN

## 2017-06-18 MED ORDER — FLUTICASONE PROPIONATE 50 MCG/ACT NA SUSP: 2.0000 | Freq: Every day | NASAL | 2 refills | Status: DC

## 2017-06-18 NOTE — ED Provider Notes (Signed)
[facility] EMERGENCY DEPARTMENT Provider Note   CSN: [id number] Arrival date & time: 06/18/17  1407     History   Chief Complaint Chief Complaint  Patient presents with  . Sore Throat    HPI Juan Woodward is a 27 y.o. male.  HPI   Juan Woodward is a 27 y.o. male who presents to the Emergency Department complaining of sore throat for 2 days.  He describes soreness associated with cough and nasal congestion.  He states his symptoms are more prominent at nighttime.  He reports frequent clearing his throat.  He denies fever, shortness of breath, chest pain, and productive cough.  He has not tried any over-the-counter medications for symptomatic relief.   Past Medical History:  Diagnosis Date  . Allergy   . Scoliosis     Patient Active Problem List   Diagnosis Date Noted  . Nasal polyp 07/20/2016  . Cerumen impaction 07/20/2016  . Healthcare maintenance 07/20/2016  . Elevated blood pressure reading 07/20/2016    History reviewed. No pertinent surgical history.     Home Medications    Prior to Admission medications   Medication Sig Start Date End Date Taking? Authorizing Provider  budesonide (RHINOCORT AQUA) 32 MCG/ACT nasal spray Place 2 sprays into both nostrils daily. 07/20/16   Lora Paula, MD  budesonide (RHINOCORT AQUA) 32 MCG/ACT nasal spray Place 1 spray into both nostrils daily. 03/30/17   Felicie Morn, NP  doxycycline (VIBRAMYCIN) 100 MG capsule Take 1 capsule (100 mg total) by mouth 2 (two) times daily. One po bid x 7 days 03/30/17   Felicie Morn, NP  fluticasone [facility]) 50 MCG/ACT nasal spray Place 1 spray into both nostrils daily. 01/25/17   Burgess Amor, PA-C  loratadine (CLARITIN) 10 MG tablet Take 1 tablet (10 mg total) by mouth daily. 07/20/16   Lora Paula, MD  montelukast (SINGULAIR) 10 MG tablet Take 1 tablet (10 mg total) by mouth daily. 07/20/16 07/20/17  Lora Paula, MD  naproxen (NAPROSYN) 375 MG tablet Take 1  tablet (375 mg total) by mouth 2 (two) times daily. 03/30/17   Felicie Morn, NP  sodium chloride (OCEAN) 0.65 % SOLN nasal spray Place 1 spray into both nostrils as needed for congestion. 03/30/17   Felicie Morn, NP    Family History Family History  Problem Relation Age of Onset  . Diabetes Maternal Grandmother   . Heart disease Neg Hx     Social History Social History   Tobacco Use  . Smoking status: Current Some Day Smoker    Packs/day: 0.20    Types: Cigarettes    Last attempt to quit: 11/07/2015    Years since quitting: 1.6  . Smokeless tobacco: Never Used  Substance Use Topics  . Alcohol use: Yes    Alcohol/week: 3.6 oz    Types: 6 Cans of beer per week    Comment: 10-12 cans of beer on the weekend every 2-3 weeks  . Drug use: No     Allergies   Patient has no known allergies.   Review of Systems Review of Systems  Constitutional: Negative for activity change, appetite change, chills and fever.  HENT: Positive for congestion, rhinorrhea and sore throat. Negative for facial swelling and trouble swallowing.   Eyes: Negative for visual disturbance.  Respiratory: Positive for cough. Negative for shortness of breath, wheezing and stridor.   Gastrointestinal: Negative for nausea and vomiting.  Musculoskeletal: Negative for neck pain and neck stiffness.  Skin: Negative  for rash.  Neurological: Negative for dizziness, weakness, numbness and headaches.  Hematological: Negative for adenopathy.  Psychiatric/Behavioral: Negative for confusion.  All other systems reviewed and are negative.    Physical Exam Updated Vital Signs BP (!) 136/93 (BP Location: Right Arm)   Pulse 79   Temp 97.7 F (36.5 C) (Oral)   Resp 18   Ht 6\' 3"  (1.905 m)   Wt 86.2 kg (190 lb)   SpO2 100%   BMI 23.75 kg/m   Physical Exam  Constitutional: He is oriented to person, place, and time. He appears well-developed and well-nourished. No distress.  HENT:  Head: Normocephalic and atraumatic.   Right Ear: Tympanic membrane and ear canal normal.  Left Ear: Tympanic membrane and ear canal normal.  Nose: Mucosal edema (Large nasal polyp present on the right) and rhinorrhea present. No septal deviation.  Mouth/Throat: Uvula is midline and mucous membranes are normal. No trismus in the jaw. No uvula swelling. Posterior oropharyngeal erythema present. No oropharyngeal exudate, posterior oropharyngeal edema or tonsillar abscesses. No tonsillar exudate.  Eyes: Conjunctivae are normal.  Neck: Normal range of motion and phonation normal. Neck supple. No Brudzinski's sign and no Kernig's sign noted.  Cardiovascular: Normal rate, regular rhythm and intact distal pulses.  No murmur heard. Pulmonary/Chest: Effort normal and breath sounds normal. No respiratory distress. He has no wheezes. He has no rales.  Musculoskeletal: He exhibits no edema.  Lymphadenopathy:    He has no cervical adenopathy.  Neurological: He is alert and oriented to person, place, and time. He exhibits normal muscle tone. Coordination normal.  Skin: Skin is warm and dry. No rash noted.  Nursing note and vitals reviewed.    ED Treatments / Results  Labs (all labs ordered are listed, but only abnormal results are displayed) Labs Reviewed - No data to display  EKG  EKG Interpretation None       Radiology No results found.  Procedures Procedures (including critical care time)  Medications Ordered in ED Medications - No data to display   Initial Impression / Assessment and Plan / ED Course  I have reviewed the triage vital signs and the nursing notes.  Pertinent labs & imaging results that were available during my care of the patient were reviewed by me and considered in my medical decision making (see chart for details).     Patient well-appearing.  Airway patent.  Symptoms are likely viral.  Patient also has a large right-sided nasal polyp.  I have advised him that he will need ENT follow-up for this,  he states he does not currently have insurance.  I will give him referral information for the social worker.    Final Clinical Impressions(s) / ED Diagnoses   Final diagnoses:  Viral pharyngitis  Nasal polyp    ED Discharge Orders    None       Pauline Ausriplett, Cambreigh Dearing, PA-C 06/18/17 1525    Blane OharaZavitz, Joshua, MD 06/18/17 1621

## 2017-06-18 NOTE — ED Triage Notes (Signed)
Sore throat x 2 days

## 2017-06-18 NOTE — Discharge Instructions (Signed)
You can try contacting financial assistance through the hospital at the main number on Monday.  You do have a large polyp in the right nostril.  He will need to contact the ear nose and throat doctor listed to arrange follow-up appointment

## 2017-10-17 ENCOUNTER — Other Ambulatory Visit: Payer: Self-pay

## 2017-10-17 ENCOUNTER — Emergency Department (HOSPITAL_COMMUNITY): Payer: Self-pay

## 2017-10-17 ENCOUNTER — Encounter (HOSPITAL_COMMUNITY): Payer: Self-pay | Admitting: *Deleted

## 2017-10-17 ENCOUNTER — Emergency Department (HOSPITAL_COMMUNITY)
Admission: EM | Admit: 2017-10-17 | Discharge: 2017-10-17 | Disposition: A | Discharge status: Home / Self Care | Payer: Self-pay | Attending: Emergency Medicine | Admitting: Emergency Medicine

## 2017-10-17 DIAGNOSIS — Z79899 Other long term (current) drug therapy: Secondary | ICD-10-CM | POA: Insufficient documentation

## 2017-10-17 DIAGNOSIS — R55 Syncope and collapse: Secondary | ICD-10-CM | POA: Insufficient documentation

## 2017-10-17 DIAGNOSIS — Z87891 Personal history of nicotine dependence: Secondary | ICD-10-CM | POA: Insufficient documentation

## 2017-10-17 DIAGNOSIS — J329 Chronic sinusitis, unspecified: Secondary | ICD-10-CM | POA: Insufficient documentation

## 2017-10-17 DIAGNOSIS — J339 Nasal polyp, unspecified: Secondary | ICD-10-CM | POA: Insufficient documentation

## 2017-10-17 HISTORY — DX: Chronic sinusitis, unspecified: J32.9

## 2017-10-17 HISTORY — DX: Nasal polyp, unspecified: J33.9

## 2017-10-17 HISTORY — DX: Epistaxis: R04.0

## 2017-10-17 HISTORY — DX: Headache: R51

## 2017-10-17 HISTORY — DX: Headache, unspecified: R51.9

## 2017-10-17 LAB — I-STAT CHEM 8, ED
BUN: 12 mg/dL (ref 6–20)
CREATININE: 1.1 mg/dL (ref 0.61–1.24)
Calcium, Ion: 1.14 mmol/L — ABNORMAL LOW (ref 1.15–1.40)
Chloride: 105 mmol/L (ref 101–111)
GLUCOSE: 101 mg/dL — AB (ref 65–99)
HEMATOCRIT: 42 % (ref 39.0–52.0)
Hemoglobin: 14.3 g/dL (ref 13.0–17.0)
Potassium: 3.8 mmol/L (ref 3.5–5.1)
Sodium: 141 mmol/L (ref 135–145)
TCO2: 25 mmol/L (ref 22–32)

## 2017-10-17 LAB — I-STAT TROPONIN, ED: Troponin i, poc: 0.01 ng/mL (ref 0.00–0.08)

## 2017-10-17 MED ORDER — METHYLPREDNISOLONE 4 MG PO TBPK: ORAL_TABLET | ORAL | 0 refills | Status: DC

## 2017-10-17 MED ORDER — FLUTICASONE PROPIONATE 50 MCG/ACT NA SUSP: 2.0000 | Freq: Every day | NASAL | 2 refills | Status: DC

## 2017-10-17 MED ORDER — AMOXICILLIN-POT CLAVULANATE 875-125 MG PO TABS: 1.0000 | ORAL_TABLET | Freq: Two times a day (BID) | ORAL | 0 refills | Status: DC

## 2017-10-17 NOTE — ED Provider Notes (Signed)
[facility] EMERGENCY DEPARTMENT Provider Note   CSN: [id number] Arrival date & time: 10/17/17  1947     History   Chief Complaint Chief Complaint  Patient presents with  . Facial Pain    HPI Juan Woodward is a 27 y.o. male.  HPI  Pt was seen at 2025.  Per pt, c/o gradual onset and persistence of constant "sinus problems" for "years."  Pt states yesterday he was blowing his nose and "pulled out a hard wax string with dried blood on it" from his right nares. States today he was bending over from a standing position and "felt like I was going to pass out." Pt states he sat down with improvement of his symptoms. Pt states he continues to have acute flair of his chronic sinus/facial pain today.  The symptoms have been associated with no other complaints. The patient has a significant history of similar symptoms previously, recently being evaluated for this complaint and multiple prior evals for same. Pt has not f/u with ENT MD as previously instructed.    Past Medical History:  Diagnosis Date  . Allergy   . Chronic sinusitis   . Frequent nosebleeds   . Headache   . Nasal polyp   . Scoliosis     Patient Active Problem List   Diagnosis Date Noted  . Nasal polyp 07/20/2016  . Cerumen impaction 07/20/2016  . Healthcare maintenance 07/20/2016  . Elevated blood pressure reading 07/20/2016    History reviewed. No pertinent surgical history.      Home Medications    Prior to Admission medications   Medication Sig Start Date End Date Taking? Authorizing Provider  doxycycline (VIBRAMYCIN) 100 MG capsule Take 1 capsule (100 mg total) by mouth 2 (two) times daily. One po bid x 7 days 03/30/17   Felicie Morn, NP  fluticasone [facility]) 50 MCG/ACT nasal spray Place 2 sprays into both nostrils daily. 06/18/17   Triplett, Tammy, PA-C  loratadine (CLARITIN) 10 MG tablet Take 1 tablet (10 mg total) by mouth daily. 07/20/16   Lora Paula, MD  magic mouthwash w/lidocaine SOLN  Take 5 mLs by mouth 3 (three) times daily as needed for mouth pain. Swish and spit do not swallow 06/18/17   Triplett, Tammy, PA-C  montelukast (SINGULAIR) 10 MG tablet Take 1 tablet (10 mg total) by mouth daily. 07/20/16 07/20/17  Lora Paula, MD  naproxen (NAPROSYN) 375 MG tablet Take 1 tablet (375 mg total) by mouth 2 (two) times daily. 03/30/17   Felicie Morn, NP  sodium chloride (OCEAN) 0.65 % SOLN nasal spray Place 1 spray into both nostrils as needed for congestion. 03/30/17   Felicie Morn, NP  Cetirizine HCl (ZYRTEC ALLERGY) 10 MG CAPS Take 1 capsule (10 mg total) by mouth at bedtime. Patient not taking: Reported on 10/31/2015 03/30/14 10/31/15  Terri Piedra, PA-C    Family History Family History  Problem Relation Age of Onset  . Diabetes Maternal Grandmother   . Heart disease Neg Hx     Social History Social History   Tobacco Use  . Smoking status: Former Smoker    Packs/day: 0.20    Types: Cigarettes    Last attempt to quit: 11/07/2015    Years since quitting: 1.9  . Smokeless tobacco: Never Used  Substance Use Topics  . Alcohol use: Yes    Alcohol/week: 3.6 oz    Types: 6 Cans of beer per week    Comment: 10-12 cans of beer on the weekend every  2-3 weeks  . Drug use: No     Allergies   Patient has no known allergies.   Review of Systems Review of Systems ROS: Statement: All systems negative except as marked or noted in the HPI; Constitutional: Negative for fever and chills. ; ; Eyes: Negative for eye pain, redness and discharge. ; ; ENMT: +nasal drainage. Negative for ear pain, hoarseness, nasal congestion, sinus pressure and sore throat. ; ; Cardiovascular: Negative for chest pain, palpitations, diaphoresis, dyspnea and peripheral edema. ; ; Respiratory: Negative for cough, wheezing and stridor. ; ; Gastrointestinal: Negative for nausea, vomiting, diarrhea, abdominal pain, blood in stool, hematemesis, jaundice and rectal bleeding. . ; ; Genitourinary: Negative  for dysuria, flank pain and hematuria. ; ; Musculoskeletal: Negative for back pain and neck pain. Negative for swelling and trauma.; ; Skin: Negative for pruritus, rash, abrasions, blisters, bruising and skin lesion.; ; Neuro: +lightheadedness. Negative for headache and neck stiffness. Negative for weakness, altered level of consciousness, altered mental status, extremity weakness, paresthesias, involuntary movement, seizure and syncope.       Physical Exam Updated Vital Signs BP (!) 140/98   Pulse 77   Temp 98.5 F (36.9 C) (Oral)   Resp 16   Ht  (1.905 m)   Wt 86.2 kg (190 lb)   SpO2 99%   BMI 23.75 kg/m   20:36:56 Orthostatic Vital Signs MM  Orthostatic Lying   BP- Lying: 129/91Abnormal    Pulse- Lying: 72       Orthostatic Sitting  BP- Sitting: 141/100Abnormal    Pulse- Sitting: 73       Orthostatic Standing at 0 minutes  BP- Standing at 0 minutes: 139/98Abnormal    Pulse- Standing at 0 minutes: 81      Physical Exam 2030: Physical examination:  Nursing notes reviewed; Vital signs and O2 SAT reviewed;  Constitutional: Well developed, Well nourished, Well hydrated, In no acute distress; Head:  Normocephalic, atraumatic; Eyes: EOMI, PERRL, No scleral icterus; ENMT: TM's clear bilat. +edemetous nasal turbinates bilat with clear rhinorrhea. +polyp right nares. No intra-nasal or intra-oral bleeding, no dried blood. Mouth and pharynx normal, Mucous membranes moist; Neck: Supple, Full range of motion, No lymphadenopathy; Cardiovascular: Regular rate and rhythm, No gallop; Respiratory: Breath sounds clear & equal bilaterally, No wheezes.  Speaking full sentences with ease, Normal respiratory effort/excursion; Chest: Nontender, Movement normal; Abdomen: Soft, Nontender, Nondistended, Normal bowel sounds; Genitourinary: No CVA tenderness; Extremities: Peripheral pulses normal, No tenderness, No edema, No calf edema or asymmetry.; Neuro: AA&Ox3, Major CN grossly intact.  Speech  clear. No gross focal motor or sensory deficits in extremities. Climbs on and off stretcher easily by himself. Gait steady.; Skin: Color normal, Warm, Dry.   ED Treatments / Results  Labs (all labs ordered are listed, but only abnormal results are displayed)   EKG EKG Interpretation  Date/Time:  Sunday Oct 17 2017 20:36:07 EDT Ventricular Rate:  72 PR Interval:    QRS Duration: 111 QT Interval:  443 QTC Calculation: 485 R Axis:   36 Text Interpretation:  Sinus rhythm Tall R wave in V2, consider RVH or PMI Abnormal T, consider ischemia, diffuse leads Baseline wander in lead(s) V4 Leads misplaced suggest repeat tracing Confirmed by Samuel Jester [phone]) on 10/17/2017 9:15:23 PM    EKG Interpretation  Date/Time:  Sunday Oct 17 2017 20:58:30 EDT Ventricular Rate:  69 PR Interval:    QRS Duration: 99 QT Interval:  410 QTC Calculation: 440 R Axis:   44 Text Interpretation:  Sinus rhythm Probable left ventricular hypertrophy Borderline T abnormalities, inferior leads Early repolarization pattern When compared with ECG of 03/30/2014 and 10/07/2009 No significant change was found Confirmed by Samuel Jester [phone]) on 10/17/2017 9:18:25 PM           Radiology   Procedures Procedures (including critical care time)  Medications Ordered in ED Medications - No data to display   Initial Impression / Assessment and Plan / ED Course  I have reviewed the triage vital signs and the nursing notes.  Pertinent labs & imaging results that were available during my care of the patient were reviewed by me and considered in my medical decision making (see chart for details).  MDM Reviewed: previous chart, nursing note and vitals Reviewed previous: labs and ECG Interpretation: labs, ECG and CT scan   Results for orders placed or performed during the hospital encounter of 10/17/17  I-stat Chem 8, ED  Result Value Ref Range   Sodium 141 135 - 145 mmol/L   Potassium 3.8 3.5 - 5.1  mmol/L   Chloride 105 101 - 111 mmol/L   BUN 12 6 - 20 mg/dL   Creatinine, Ser 6.04 0.61 - 1.24 mg/dL   Glucose, Bld 540 (H) 65 - 99 mg/dL   Calcium, Ion 9.81 (L) 1.15 - 1.40 mmol/L   TCO2 25 22 - 32 mmol/L   Hemoglobin 14.3 13.0 - 17.0 g/dL   HCT 19.1 47.8 - 29.5 %  I-stat troponin, ED  Result Value Ref Range   Troponin i, poc 0.01 0.00 - 0.08 ng/mL   Comment 3           Ct Head Wo Contrast Result Date: 10/17/2017 CLINICAL DATA:  Chronic headache and sinus pressure. Near syncope. Photosensitivity. Recent epistaxis. EXAM: CT HEAD WITHOUT CONTRAST CT MAXILLOFACIAL WITHOUT CONTRAST TECHNIQUE: Multidetector CT imaging of the head and maxillofacial structures were performed using the standard protocol without intravenous contrast. Multiplanar CT image reconstructions of the maxillofacial structures were also generated. COMPARISON:  CT of the head performed 10/07/2009 FINDINGS: CT HEAD FINDINGS Brain: No evidence of acute infarction, hemorrhage, hydrocephalus, extra-axial collection or mass lesion/mass effect. The posterior fossa, including the cerebellum, brainstem and fourth ventricle, is within normal limits. The third and lateral ventricles, and basal ganglia are unremarkable in appearance. The cerebral hemispheres are symmetric in appearance, with normal gray-white differentiation. No mass effect or midline shift is seen. Vascular: No hyperdense vessel or unexpected calcification. Skull: There is no evidence of fracture; visualized osseous structures are unremarkable in appearance. Other: No significant soft tissue abnormalities are seen. CT MAXILLOFACIAL FINDINGS Osseous: There is no evidence of fracture or dislocation. The maxilla and mandible appear intact. The nasal bone is unremarkable in appearance. The visualized dentition demonstrates no acute abnormality. Orbits: The orbits are intact bilaterally. Sinuses: As before, there is extensive sinus disease, with complete opacification of the nasal  passages, right ethmoid air cells, right maxillary sinus and right frontal sinus with inspissated mucus. There is chronic leftward bowing of the nasal septum and medial wall of the left maxillary sinus from inspissated mucus. The mastoid air cells are well-aerated. Soft tissues: No significant soft tissue abnormalities are seen. The parapharyngeal fat planes are preserved. The nasopharynx, oropharynx and hypopharynx are unremarkable in appearance. The visualized portions of the valleculae and piriform sinuses are grossly unremarkable. The parotid and submandibular glands are within normal limits. No cervical lymphadenopathy is seen. IMPRESSION: 1. No evidence of traumatic intracranial injury or fracture. 2. No evidence of fracture  or dislocation with regard to the maxillofacial structures. 3. Extensive sinus disease again noted, with complete opacification of the nasal passages, right ethmoid air cells, right maxillary sinus and right frontal sinus with inspissated mucus. Chronic leftward bowing of the nasal septum and medial wall of the left maxillary sinus from inspissated mucus. Electronically Signed   By: Roanna Raider M.D.   On: 10/17/2017 22:25   Ct Maxillofacial Wo Cm Result Date: 10/17/2017 CLINICAL DATA:  Chronic headache and sinus pressure. Near syncope. Photosensitivity. Recent epistaxis. EXAM: CT HEAD WITHOUT CONTRAST CT MAXILLOFACIAL WITHOUT CONTRAST TECHNIQUE: Multidetector CT imaging of the head and maxillofacial structures were performed using the standard protocol without intravenous contrast. Multiplanar CT image reconstructions of the maxillofacial structures were also generated. COMPARISON:  CT of the head performed 10/07/2009 FINDINGS: CT HEAD FINDINGS Brain: No evidence of acute infarction, hemorrhage, hydrocephalus, extra-axial collection or mass lesion/mass effect. The posterior fossa, including the cerebellum, brainstem and fourth ventricle, is within normal limits. The third and lateral  ventricles, and basal ganglia are unremarkable in appearance. The cerebral hemispheres are symmetric in appearance, with normal gray-white differentiation. No mass effect or midline shift is seen. Vascular: No hyperdense vessel or unexpected calcification. Skull: There is no evidence of fracture; visualized osseous structures are unremarkable in appearance. Other: No significant soft tissue abnormalities are seen. CT MAXILLOFACIAL FINDINGS Osseous: There is no evidence of fracture or dislocation. The maxilla and mandible appear intact. The nasal bone is unremarkable in appearance. The visualized dentition demonstrates no acute abnormality. Orbits: The orbits are intact bilaterally. Sinuses: As before, there is extensive sinus disease, with complete opacification of the nasal passages, right ethmoid air cells, right maxillary sinus and right frontal sinus with inspissated mucus. There is chronic leftward bowing of the nasal septum and medial wall of the left maxillary sinus from inspissated mucus. The mastoid air cells are well-aerated. Soft tissues: No significant soft tissue abnormalities are seen. The parapharyngeal fat planes are preserved. The nasopharynx, oropharynx and hypopharynx are unremarkable in appearance. The visualized portions of the valleculae and piriform sinuses are grossly unremarkable. The parotid and submandibular glands are within normal limits. No cervical lymphadenopathy is seen. IMPRESSION: 1. No evidence of traumatic intracranial injury or fracture. 2. No evidence of fracture or dislocation with regard to the maxillofacial structures. 3. Extensive sinus disease again noted, with complete opacification of the nasal passages, right ethmoid air cells, right maxillary sinus and right frontal sinus with inspissated mucus. Chronic leftward bowing of the nasal septum and medial wall of the left maxillary sinus from inspissated mucus. Electronically Signed   By: Roanna Raider M.D.   On: 10/17/2017  22:25    2230:   Workup reassuring. Not orthostatic on VS. Will tx symptomatically at this time. Pt encouraged to f/u with ENT for his chronic sinusitis/nasal polyp for good continuity of care and definitive treatment.  Pt verb understanding. Dx and testing d/w pt.  Questions answered.  Verb understanding, agreeable to d/c home with outpt f/u.    Final Clinical Impressions(s) / ED Diagnoses   Final diagnoses:  None    ED Discharge Orders    None       Samuel Jester, DO 10/22/17 1610

## 2017-10-17 NOTE — ED Triage Notes (Addendum)
Pt states that he has had problems with his sinus's for awhile, last night pt reports that he pulled what appeared to be hard "wax with dried blood" from his right nare, pt reports that he almost passed out this am when he bent over everything went black and he was able to be assisted to a sitting position and was better, pt also complains of headache that is sensitive to lights,

## 2017-10-17 NOTE — Discharge Instructions (Addendum)
Take over the counter decongestant (such as sudafed), as well as antihistamine (such as Claritin, zyrtec, or allegra), as directed on packaging, for the next week.  Use over the counter normal saline nasal spray, with frequent nose blowing, several times per day for the next 2 weeks.  Call your regular medical doctor and the ENT doctor tomorrow to schedule a follow up appointment within the next week.  Return to the Emergency Department immediately if worsening.

## 2018-05-14 ENCOUNTER — Other Ambulatory Visit: Payer: Self-pay

## 2018-05-14 ENCOUNTER — Emergency Department (HOSPITAL_COMMUNITY)
Admission: EM | Admit: 2018-05-14 | Discharge: 2018-05-14 | Disposition: A | Discharge status: Home / Self Care | Payer: Self-pay | Attending: Emergency Medicine | Admitting: Emergency Medicine

## 2018-05-14 ENCOUNTER — Encounter (HOSPITAL_COMMUNITY): Payer: Self-pay | Admitting: Emergency Medicine

## 2018-05-14 DIAGNOSIS — J329 Chronic sinusitis, unspecified: Secondary | ICD-10-CM

## 2018-05-14 DIAGNOSIS — J019 Acute sinusitis, unspecified: Secondary | ICD-10-CM | POA: Insufficient documentation

## 2018-05-14 DIAGNOSIS — J209 Acute bronchitis, unspecified: Secondary | ICD-10-CM | POA: Insufficient documentation

## 2018-05-14 DIAGNOSIS — Z87891 Personal history of nicotine dependence: Secondary | ICD-10-CM | POA: Insufficient documentation

## 2018-05-14 DIAGNOSIS — J208 Acute bronchitis due to other specified organisms: Secondary | ICD-10-CM

## 2018-05-14 MED ORDER — CEPHALEXIN 500 MG PO CAPS
500.0000 mg | ORAL_CAPSULE | Freq: Once | ORAL | Status: AC
  Administered 2018-05-14: 500 mg via ORAL
  Filled 2018-05-14: qty 1

## 2018-05-14 MED ORDER — PREDNISONE 20 MG PO TABS
40.0000 mg | ORAL_TABLET | Freq: Once | ORAL | Status: AC
Start: 2018-05-14 — End: 2018-05-14
  Administered 2018-05-14: 40 mg via ORAL
  Filled 2018-05-14: qty 2

## 2018-05-14 MED ORDER — ALBUTEROL SULFATE HFA 108 (90 BASE) MCG/ACT IN AERS
2.0000 | INHALATION_SPRAY | Freq: Once | RESPIRATORY_TRACT | Status: AC
  Administered 2018-05-14: 2 via RESPIRATORY_TRACT
  Filled 2018-05-14: qty 6.7

## 2018-05-14 MED ORDER — LORATADINE-PSEUDOEPHEDRINE ER 5-120 MG PO TB12: 1.0000 | ORAL_TABLET | Freq: Two times a day (BID) | ORAL | 0 refills | Status: DC

## 2018-05-14 MED ORDER — AZITHROMYCIN 250 MG PO TABS
500.0000 mg | ORAL_TABLET | Freq: Once | ORAL | Status: AC
  Administered 2018-05-14: 500 mg via ORAL
  Filled 2018-05-14: qty 2

## 2018-05-14 MED ORDER — AZITHROMYCIN 250 MG PO TABS: ORAL_TABLET | ORAL | 0 refills | Status: DC

## 2018-05-14 MED ORDER — PSEUDOEPHEDRINE HCL 60 MG PO TABS
60.0000 mg | ORAL_TABLET | Freq: Once | ORAL | Status: AC
  Administered 2018-05-14: 60 mg via ORAL
  Filled 2018-05-14: qty 1

## 2018-05-14 MED ORDER — DEXAMETHASONE 4 MG PO TABS: 4.0000 mg | ORAL_TABLET | Freq: Two times a day (BID) | ORAL | 0 refills | Status: DC

## 2018-05-14 NOTE — ED Triage Notes (Signed)
Patient c/o productive cough with body aches, nasal drainage, and "cold-sweats." Patient unsure of any fevers. Patient taking Dayquil with some relief last dose last night. Sputum thick green per patient. Patient's fiance recently had similar symptoms.

## 2018-05-14 NOTE — Discharge Instructions (Addendum)
Please increase fluids.  Please wash hands frequently.  Usual mask until symptoms have resolved.  Use 2 puffs of albuterol every 4 hours.  Use Decadron and Claritin-D every 12 hours or 2 times daily.  Zithromax daily starting December 8.  Please see your primary physician or return to the emergency department if any changes in your condition, problems, or current concerns.

## 2018-05-14 NOTE — ED Provider Notes (Signed)
[facility] EMERGENCY DEPARTMENT Provider Note   CSN: [id number] Arrival date & time: 05/14/18  1323     History   Chief Complaint Chief Complaint  Patient presents with  . Cough    HPI Juan Woodward is a 27 y.o. male.  The history is provided by the patient.  Cough  This is a new problem. The current episode started 2 days ago. The problem occurs constantly. The problem has been gradually worsening. The cough is productive of sputum. There has been no fever. Associated symptoms include chills, rhinorrhea and myalgias. Pertinent negatives include no chest pain, no shortness of breath and no wheezing. Treatments tried: day-quil. The treatment provided no relief. His past medical history is significant for bronchitis. His past medical history does not include asthma.    Past Medical History:  Diagnosis Date  . Allergy   . Chronic sinusitis   . Frequent nosebleeds   . Headache   . Nasal polyp   . Scoliosis     Patient Active Problem List   Diagnosis Date Noted  . Nasal polyp 07/20/2016  . Cerumen impaction 07/20/2016  . Healthcare maintenance 07/20/2016  . Elevated blood pressure reading 07/20/2016    History reviewed. No pertinent surgical history.      Home Medications    Prior to Admission medications   Medication Sig Start Date End Date Taking? Authorizing Provider  amoxicillin-clavulanate (AUGMENTIN) 875-125 MG tablet Take 1 tablet by mouth every 12 (twelve) hours. 10/17/17   Samuel JesterMcManus, Kathleen, DO  fluticasone (FLONASE) 50 MCG/ACT nasal spray Place 2 sprays into both nostrils daily. 10/17/17   Samuel JesterMcManus, Kathleen, DO  methylPREDNISolone (MEDROL DOSEPAK) 4 MG TBPK tablet follow package directions 10/17/17   Samuel JesterMcManus, Kathleen, DO    Family History Family History  Problem Relation Age of Onset  . Diabetes Maternal Grandmother   . Heart disease Neg Hx     Social History Social History   Tobacco Use  . Smoking status: Former Smoker    Packs/day:  0.20    Types: Cigarettes    Last attempt to quit: 11/07/2015    Years since quitting: 2.5  . Smokeless tobacco: Never Used  Substance Use Topics  . Alcohol use: Yes    Alcohol/week: 6.0 standard drinks    Types: 6 Cans of beer per week    Comment: 10-12 cans of beer on the weekend every 2-3 weeks  . Drug use: No     Allergies   Patient has no known allergies.   Review of Systems Review of Systems  Constitutional: Positive for chills. Negative for activity change.       All ROS Neg except as noted in HPI  HENT: Positive for congestion and rhinorrhea. Negative for nosebleeds.   Eyes: Negative for photophobia and discharge.  Respiratory: Positive for cough. Negative for shortness of breath and wheezing.   Cardiovascular: Negative for chest pain and palpitations.  Gastrointestinal: Negative for abdominal pain and blood in stool.  Genitourinary: Negative for dysuria, frequency and hematuria.  Musculoskeletal: Positive for myalgias. Negative for arthralgias, back pain and neck pain.  Skin: Negative.   Neurological: Negative for dizziness, seizures and speech difficulty.  Psychiatric/Behavioral: Negative for confusion and hallucinations.     Physical Exam Updated Vital Signs BP (!) 145/84 (BP Location: Right Arm)   Pulse 72   Temp 97.6 F (36.4 C) (Oral)   Resp 18   Ht 6\' 3"  (1.905 m)   Wt 89.8 kg   SpO2 98%  BMI 24.75 kg/m   Physical Exam  Constitutional: He is oriented to person, place, and time. He appears well-developed and well-nourished.  Non-toxic appearance.  HENT:  Head: Normocephalic.  Right Ear: Tympanic membrane and external ear normal.  Left Ear: Tympanic membrane and external ear normal.  Nasal congestion present.  Eyes: Pupils are equal, round, and reactive to light. EOM and lids are normal.  Neck: Normal range of motion. Neck supple. Carotid bruit is not present.  Cardiovascular: Normal rate, regular rhythm, normal heart sounds, intact distal pulses  and normal pulses.  Pulmonary/Chest: No respiratory distress. He has rhonchi.  Congestion bilat. Mostly clears with cough. Course breath sounds.  Abdominal: Soft. Bowel sounds are normal. There is no tenderness. There is no guarding.  Musculoskeletal: Normal range of motion.  Lymphadenopathy:       Head (right side): No submandibular adenopathy present.       Head (left side): No submandibular adenopathy present.    He has no cervical adenopathy.  Neurological: He is alert and oriented to person, place, and time. He has normal strength. No cranial nerve deficit or sensory deficit.  Skin: Skin is warm and dry.  Psychiatric: He has a normal mood and affect. His speech is normal.  Nursing note and vitals reviewed.    ED Treatments / Results  Labs (all labs ordered are listed, but only abnormal results are displayed) Labs Reviewed - No data to display  EKG None  Radiology No results found.  Procedures Procedures (including critical care time)  Medications Ordered in ED Medications - No data to display   Initial Impression / Assessment and Plan / ED Course  I have reviewed the triage vital signs and the nursing notes.  Pertinent labs & imaging results that were available during my care of the patient were reviewed by me and considered in my medical decision making (see chart for details).       Final Clinical Impressions(s) / ED Diagnoses MDM  Vital signs within normal limits.  Pulse oximetry is 98% on room air.  Within normal limits by my interpretation.  Patient reports thick green sputum, congestion, and generally not feeling well.  There is no nuchal rigidity noted.  There is no unusual rash noted.  The patient speaks in complete sentences without problem.  The examination favors sinusitis and bronchitis.  Patient will be treated with Zithromax, Decadron, and albuterol inhaler.  Patient given instructions on increasing fluids and washing hands frequently.  Patient  will return to the emergency department if any changes in condition, problems, or concerns.   Final diagnoses:  Acute bronchitis due to other specified organisms  Sinusitis, unspecified chronicity, unspecified location    ED Discharge Orders         Ordered    azithromycin (ZITHROMAX) 250 MG tablet     05/14/18 1625    loratadine-pseudoephedrine (CLARITIN-D 12 HOUR) 5-120 MG tablet  2 times daily     05/14/18 1625    dexamethasone (DECADRON) 4 MG tablet  2 times daily with meals     05/14/18 1625           Ivery Quale, PA-C 05/14/18 1633    Vanetta Mulders, MD 05/14/18 1904

## 2019-01-23 ENCOUNTER — Emergency Department (HOSPITAL_COMMUNITY)
Admission: EM | Admit: 2019-01-23 | Discharge: 2019-01-23 | Discharge status: Against Medical Advice | Payer: Self-pay | Attending: Emergency Medicine | Admitting: Emergency Medicine

## 2019-01-23 ENCOUNTER — Encounter (HOSPITAL_COMMUNITY): Payer: Self-pay

## 2019-01-23 ENCOUNTER — Other Ambulatory Visit: Payer: Self-pay

## 2019-01-23 DIAGNOSIS — Z5321 Procedure and treatment not carried out due to patient leaving prior to being seen by health care provider: Secondary | ICD-10-CM | POA: Insufficient documentation

## 2019-01-23 NOTE — ED Notes (Signed)
Shortly after getting into the room, patient ambulated out of facility without informing staff. Patient was in any acute distress. Alex, PA was going to see patient but he left prior. Informed her that he left.

## 2019-01-23 NOTE — ED Triage Notes (Signed)
Pt states he has been having sinus issues for 14+ years that have continued to get worse. Pt states that it has become unbearable.  No relief with OTC meds or Rx meds. Pt states it feels like swollen meat in his nose

## 2019-01-27 ENCOUNTER — Other Ambulatory Visit: Payer: Self-pay

## 2019-01-27 ENCOUNTER — Emergency Department (HOSPITAL_COMMUNITY)
Admission: EM | Admit: 2019-01-27 | Discharge: 2019-01-27 | Disposition: A | Discharge status: Home / Self Care | Payer: Self-pay | Attending: Emergency Medicine | Admitting: Emergency Medicine

## 2019-01-27 DIAGNOSIS — Z87891 Personal history of nicotine dependence: Secondary | ICD-10-CM | POA: Insufficient documentation

## 2019-01-27 DIAGNOSIS — J329 Chronic sinusitis, unspecified: Secondary | ICD-10-CM | POA: Insufficient documentation

## 2019-01-27 DIAGNOSIS — Z79899 Other long term (current) drug therapy: Secondary | ICD-10-CM | POA: Insufficient documentation

## 2019-01-27 NOTE — TOC Initial Note (Signed)
Transition of Care [facility]) - Initial/Assessment Note    Patient Details  Name: Juan Woodward MRN: [id number] Date of Birth: 05/31/1991  Transition of Care [facility]) CM/SW Contact:    Fuller Mandril, RN Phone Number: 01/27/2019, 3:12 PM  Clinical Narrative:                 [facility] consulted to assist uninsured pt with PCP establishment and insurance.  Expected Discharge Plan: Home/Self Care Barriers to Discharge: Barriers Resolved   Patient Goals and CMS Choice Patient states their goals for this hospitalization and ongoing recovery are:: get a doctor for my nose      Expected Discharge Plan and Services Expected Discharge Plan: Home/Self Care   Discharge Planning Services: CM Consult, Follow-up appt scheduled, GCCN / P4HM (established/new)   Living arrangements for the past 2 months: Single Family Home Expected Discharge Date: 01/27/19                                    Prior Living Arrangements/Services Living arrangements for the past 2 months: Single Family Home Lives with:: Relatives   Do you feel safe going back to the place where you live?: Yes               Activities of Daily Living      Permission Sought/Granted Permission sought to share information with : Chartered certified accountant granted to share information with : Yes, Verbal Permission Granted  Share Information with NAME: [facility]           Emotional Assessment Appearance:: Appears stated age Attitude/Demeanor/Rapport: Engaged Affect (typically observed): Appropriate Orientation: : Oriented to Self, Oriented to Place, Oriented to  Time, Oriented to Situation Alcohol / Substance Use: Not Applicable Psych Involvement: No (comment)  Admission diagnosis:  NASAL SWELLING Patient Active Problem List   Diagnosis Date Noted  . Nasal polyp 07/20/2016  . Cerumen impaction 07/20/2016  . Healthcare maintenance 07/20/2016  . Elevated blood pressure reading 07/20/2016    PCP:  Patient, No Pcp Per Pharmacy:   [facility], [place] [address] Phone: [phone] Fax: [phone]  [id number] [place], [place] [address] [place] [address] Phone: [phone] Fax: [phone]     Social Determinants of Health ([place]) Interventions    Readmission Risk Interventions No flowsheet data found.

## 2019-01-27 NOTE — TOC Transition Note (Signed)
Transition of Care [facility]) - CM/SW Discharge Note   Patient Details  Name: DRURY ARDIZZONE MRN: [id number] Date of Birth: 1990-07-03  Transition of Care [facility]) CM/SW Contact:  Fuller Mandril, RN Phone Number: 01/27/2019, 3:16 PM   Clinical Narrative:       Final next level of care: Home/Self Care Barriers to Discharge: Barriers Resolved   Patient Goals and CMS Choice Patient states their goals for this hospitalization and ongoing recovery are:: get a doctor for my nose      Discharge Placement                       Discharge Plan and Services   Discharge Planning Services: CM Consult, Follow-up appt scheduled, GCCN / P4HM (established/new)                  Saydi Kobel J. Clydene Laming, RN, BSN, [place] [phone]  [facility] set up appointment with [place].  Spoke with pt at bedside and advised to please arrive 15 min early and take a picture ID and your current medications.  Pt verbalizes understanding of keeping appointment.  EDCM also gave pt [employer] application and information.                 Social Determinants of Health (SDOH) Interventions     Readmission Risk Interventions No flowsheet data found.

## 2019-01-27 NOTE — ED Triage Notes (Addendum)
Pt seen at [facility] 01/23/19 for sinus congestion. Pt states [facility] did not want to help him and he can not breath out of his nose for 2X weeks. Endorses pressure behind eyes and constant headaches. Denies any drainage.

## 2019-01-27 NOTE — ED Provider Notes (Signed)
[place] EMERGENCY DEPARTMENT Provider Note   CSN: [id number] Arrival date & time: 01/27/19  1315     History   Chief Complaint Chief Complaint  Patient presents with   Facial Pain   Recurrent Sinusitis    HPI Juan Woodward is a 28 y.o. male with past medical history of chronic sinusitis, nasal polyp, presenting to the emergency department with complaint of persistent sinus problems.  He states this happens throughout the year and has been happening for 14+ years.  He has used everything he could think of over-the-counter including Mucinex, antihistamines, nasal sprays without relief.  He has never followed up with ENT because he does not have health insurance though he has been given multiple referrals out of the ED.  During his visit in May 2019 his CT scan of his maxillofacial structures which reveals extensive sinus disease.  He has no fever or systemic symptoms.     The history is provided by the patient and medical records.    Past Medical History:  Diagnosis Date   Allergy    Chronic sinusitis    Frequent nosebleeds    Headache    Nasal polyp    Scoliosis     Patient Active Problem List   Diagnosis Date Noted   Nasal polyp 07/20/2016   Cerumen impaction 07/20/2016   Healthcare maintenance 07/20/2016   Elevated blood pressure reading 07/20/2016    No past surgical history on file.      Home Medications    Prior to Admission medications   Medication Sig Start Date End Date Taking? Authorizing Provider  amoxicillin-clavulanate (AUGMENTIN) 875-125 MG tablet Take 1 tablet by mouth every 12 (twelve) hours. 10/17/17   Francine Graven, DO  azithromycin (ZITHROMAX) 250 MG tablet 1 po daily 05/14/18   Lily Kocher, PA-C  dexamethasone (DECADRON) 4 MG tablet Take 1 tablet (4 mg total) by mouth 2 (two) times daily with a meal. 05/14/18   Lily Kocher, PA-C  fluticasone (FLONASE) 50 MCG/ACT nasal spray Place 2 sprays into  both nostrils daily. 10/17/17   Francine Graven, DO  loratadine-pseudoephedrine (CLARITIN-D 12 HOUR) 5-120 MG tablet Take 1 tablet by mouth 2 (two) times daily. 05/14/18   Lily Kocher, PA-C  methylPREDNISolone (MEDROL DOSEPAK) 4 MG TBPK tablet follow package directions 10/17/17   Francine Graven, DO    Family History Family History  Problem Relation Age of Onset   Diabetes Maternal Grandmother    Heart disease Neg Hx     Social History Social History   Tobacco Use   Smoking status: Former Smoker    Packs/day: 0.20    Types: Cigarettes    Quit date: 11/07/2015    Years since quitting: 3.2   Smokeless tobacco: Never Used  Substance Use Topics   Alcohol use: Yes    Alcohol/week: 6.0 standard drinks    Types: 6 Cans of beer per week    Comment: 10-12 cans of beer on the weekend every 2-3 weeks   Drug use: No     Allergies   Ibuprofen   Review of Systems Review of Systems  HENT: Positive for sinus pressure and sinus pain.   All other systems reviewed and are negative.    Physical Exam Updated Vital Signs BP (!) 156/101 (BP Location: Right Arm)    Pulse 83    Temp (!) 97.5 F (36.4 C) (Oral)    Resp 19    Ht 6\' 4"  (1.93 m)    Wt 89.8  kg    SpO2 95%    BMI 24.10 kg/m   Physical Exam Vitals signs and nursing note reviewed.  Constitutional:      General: He is not in acute distress.    Appearance: He is well-developed.  HENT:     Head: Normocephalic and atraumatic.     Nose:     Right Sinus: Maxillary sinus tenderness and frontal sinus tenderness present.     Left Sinus: Maxillary sinus tenderness and frontal sinus tenderness present.     Comments: White/clear mucus noted in the nares.  The left turbinate is edematous and red.  The right turbinate is unable to be visualized secondary to what appears to be a polyp in the right nare. Eyes:     Conjunctiva/sclera: Conjunctivae normal.  Cardiovascular:     Rate and Rhythm: Normal rate and regular rhythm.    Pulmonary:     Effort: Pulmonary effort is normal. No respiratory distress.     Breath sounds: Normal breath sounds.  Neurological:     Mental Status: He is alert.  Psychiatric:        Mood and Affect: Mood normal.        Behavior: Behavior normal.      ED Treatments / Results  Labs (all labs ordered are listed, but only abnormal results are displayed) Labs Reviewed - No data to display  EKG None  Radiology No results found.  Procedures Procedures (including critical care time)  Medications Ordered in ED Medications - No data to display   Initial Impression / Assessment and Plan / ED Course  I have reviewed the triage vital signs and the nursing notes.  Pertinent labs & imaging results that were available during my care of the patient were reviewed by me and considered in my medical decision making (see chart for details).        Patient with chronic sinusitis and CT scan last year consistent with extensive sinus disease, presenting for similar symptoms.  He has never followed up with ENT due to not having health insurance, he has never made an attempt to call.  He has no new infectious symptoms that would warrant antibiotics for a bacterial sinusitis.  I spoke with case management who provided patient with resources including a PCP appointment where he can obtain an orange card and then from there be seen by ENT with some financial assistance.  He is very appreciative of this.  Encouraged continue over-the-counter medications as they help, and return for any fever.  Safe for discharge.  Discussed results, findings, treatment and follow up. Patient advised of return precautions. Patient verbalized understanding and agreed with plan.  Final Clinical Impressions(s) / ED Diagnoses   Final diagnoses:  Chronic sinusitis, unspecified location    ED Discharge Orders    None       Millie Shorb, [place] N, PA-C 01/27/19 1444    Cathren LaineSteinl, Kevin, MD 01/28/19 [id number]

## 2019-02-09 ENCOUNTER — Telehealth (INDEPENDENT_AMBULATORY_CARE_PROVIDER_SITE_OTHER): Payer: Self-pay | Admitting: Primary Care

## 2019-02-09 DIAGNOSIS — Z7689 Persons encountering health services in other specified circumstances: Secondary | ICD-10-CM

## 2019-02-09 DIAGNOSIS — J019 Acute sinusitis, unspecified: Secondary | ICD-10-CM

## 2019-02-09 DIAGNOSIS — J339 Nasal polyp, unspecified: Secondary | ICD-10-CM

## 2019-02-09 NOTE — Progress Notes (Signed)
Virtual Visit via Telephone Note  I connected with Juan Woodward on 02/09/19 at  2:50 PM EDT by telephone and verified that I am speaking with the correct person using two identifiers.   I discussed the limitations, risks, security and privacy concerns of performing an evaluation and management service by telephone and the availability of in person appointments. I also discussed with the patient that there may be a patient responsible charge related to this service. The patient expressed understanding and agreed to proceed.  Web visit History of Present Illness: Mr Juan Woodward is establishing care with primary care to provide health maintenance and management of chronic disease. Patient presented to the emergency department with complaint of persistent sinus problems. Patient states he has taken everything over-the-counter   Observations/Objective: Review of Systems  HENT: Positive for congestion and sinus pain.   Eyes:       Watery eyes   Respiratory: Positive for cough and hemoptysis.   Neurological: Positive for headaches.    Assessment and Plan: Juan Woodward was seen today for sinusitis.  Diagnoses and all orders for this visit:  Nasal polyp  May 2019 his CT scan of his maxillofacial structures which reveals extensive sinus disease. He has not seen ENT due to lack of insurance. Provided him with information to apply for [facility] assistance program.  Encounter to establish care Juan Woodward spoke with case management who provided patient with resources including a PCP appointment where he can obtain an orange card and then from there be seen by ENT with some financial assistance. Establishing care with PCP today.  Acute sinusitis treated with antibiotics in the past 60 days Infections are common during spring and fall due to  increased pollination.  Antihistamines can be used daily with intrnasal steroid spray limited to 14 days to avoid rebound effects. May use normal  saline nasal spray daily.  -     Ambulatory referral to ENT    Follow Up Instructions:    I discussed the assessment and treatment plan with the patient. The patient was provided an opportunity to ask questions and all were answered. The patient agreed with the plan and demonstrated an understanding of the instructions.   The patient was advised to call back or seek an in-person evaluation if the symptoms worsen or if the condition fails to improve as anticipated.  I provided 30 minsface-to-face time during this encounter. Includes review of encounter, labs and imaging   Kerin Perna, NP

## 2019-02-20 ENCOUNTER — Ambulatory Visit (HOSPITAL_COMMUNITY)
Admission: EM | Admit: 2019-02-20 | Discharge: 2019-02-20 | Disposition: A | Discharge status: Home / Self Care | Payer: Self-pay | Attending: Family Medicine | Admitting: Family Medicine

## 2019-02-20 ENCOUNTER — Encounter (HOSPITAL_COMMUNITY): Payer: Self-pay

## 2019-02-20 ENCOUNTER — Other Ambulatory Visit: Payer: Self-pay

## 2019-02-20 ENCOUNTER — Ambulatory Visit: Payer: Self-pay | Attending: Family Medicine

## 2019-02-20 DIAGNOSIS — J339 Nasal polyp, unspecified: Secondary | ICD-10-CM

## 2019-02-20 DIAGNOSIS — R030 Elevated blood-pressure reading, without diagnosis of hypertension: Secondary | ICD-10-CM

## 2019-02-20 NOTE — ED Triage Notes (Signed)
Patient presents to Urgent Care with complaints of blockage in his nose since several months ago, possibly a year. Patient reports he was told he has something growing in his nose (visible), has been sent to several places to be treated, just received his orange card for health insurance.

## 2019-02-22 NOTE — ED Provider Notes (Signed)
[place]   [id number] 02/20/19 Arrival Time: 0258  ASSESSMENT & PLAN:  1. Right nasal polyps   2. Elevated blood pressure reading without diagnosis of hypertension     He really needs to see an ENT. In process of applying for an orange care. It may benefit him to pay out of pocket for an ENT consultation in order to establish treatment options for the future.  See AVS for written information provided on hypertension. Encouraged follow up with PCP or establishment of care with a PCP. May return here to recheck BP at any time. Also recommended several lifestyle modifications (weight loss, dietary sodium restriction, increase physical activity and moderate alcohol consumption).  Reviewed expectations re: course of current medical issues. Questions answered. Outlined signs and symptoms indicating need for more acute intervention. Patient verbalized understanding. After Visit Summary given.   SUBJECTIVE: History from: patient.  Juan Woodward is a 28 y.o. male who presents with approx 9 months of a nasal polyp occluding right nasal opening. Has been seen other places. Antibiotic and steroids have not helped. No pain associated. "But it feels miserable". Difficulty breathing through nose at times; affecting sleep. No specific aggravating or alleviating factors reported.Marland Kitchen History of frequent sinus infections: no.  Increased blood pressure noted today. Reports that he has not been treated for hypertension in the past.  He reports no TIA's, no chest pain on exertion, no dyspnea on exertion, no swelling of ankles, no orthostatic dizziness or lightheadedness, no orthopnea or paroxysmal nocturnal dyspnea, no palpitations and no intermittent claudication symptoms.  Social History   Tobacco Use  Smoking Status Former Smoker  . Packs/day: 0.20  . Types: Cigarettes  . Quit date: 11/07/2015  . Years since quitting: 3.2  Smokeless Tobacco Never Used    ROS: As per HPI.   OBJECTIVE:  Vitals:   02/20/19 1339  BP: (!) 142/106  Pulse: 71  Resp: 16  Temp: 98.7 F (37.1 C)  TempSrc: Temporal  SpO2: 100%     General appearance: alert; no distress HEENT: nasal polpy occluding left nasal opening; no signs of active infection; no bleeding; no specific maxillary tenderness to palpation Neck: supple without LAD; trachea midline Lungs: unlabored respirations, symmetrical air entry; no respiratory distress Skin: warm and dry Psychological: alert and cooperative; normal mood and affect  Allergies  Allergen Reactions  . Ibuprofen     Face swells    Past Medical History:  Diagnosis Date  . Allergy   . Chronic sinusitis   . Frequent nosebleeds   . Headache   . Nasal polyp   . Scoliosis    Family History  Problem Relation Age of Onset  . Hypertension Mother   . Diabetes Maternal Grandmother   . Heart disease Neg Hx    Social History   Socioeconomic History  . Marital status: Single    Spouse name: Not on file  . Number of children: Not on file  . Years of education: Not on file  . Highest education level: Not on file  Occupational History  . Not on file  Social Needs  . Financial resource strain: Not on file  . Food insecurity    Worry: Not on file    Inability: Not on file  . Transportation needs    Medical: Not on file    Non-medical: Not on file  Tobacco Use  . Smoking status: Former Smoker    Packs/day: 0.20    Types: Cigarettes    Quit date:  11/07/2015    Years since quitting: 3.2  . Smokeless tobacco: Never Used  Substance and Sexual Activity  . Alcohol use: Yes    Alcohol/week: 6.0 standard drinks    Types: 6 Cans of beer per week    Comment: 10-12 cans of beer on the weekend every 2-3 weeks  . Drug use: No  . Sexual activity: Yes    Birth control/protection: Condom  Lifestyle  . Physical activity    Days per week: Not on file    Minutes per session: Not on file  . Stress: Not on file  Relationships  . Social  Musicianconnections    Talks on phone: Not on file    Gets together: Not on file    Attends religious service: Not on file    Active member of club or organization: Not on file    Attends meetings of clubs or organizations: Not on file    Relationship status: Not on file  . Intimate partner violence    Fear of current or ex partner: Not on file    Emotionally abused: Not on file    Physically abused: Not on file    Forced sexual activity: Not on file  Other Topics Concern  . Not on file  Social History Narrative  . Not on file            Mardella LaymanHagler, Roise Emert, MD 02/22/19 [id number]

## 2019-04-03 ENCOUNTER — Ambulatory Visit (INDEPENDENT_AMBULATORY_CARE_PROVIDER_SITE_OTHER): Payer: Self-pay | Admitting: Otolaryngology

## 2019-04-03 ENCOUNTER — Other Ambulatory Visit: Payer: Self-pay

## 2019-04-03 DIAGNOSIS — J330 Polyp of nasal cavity: Secondary | ICD-10-CM

## 2019-04-03 DIAGNOSIS — J310 Chronic rhinitis: Secondary | ICD-10-CM

## 2019-04-03 DIAGNOSIS — J343 Hypertrophy of nasal turbinates: Secondary | ICD-10-CM

## 2019-04-03 MED FILL — ?PREDNISONE 10 MG TABLET: 10 | 12 days supply | Qty: 42 | Fill #0

## 2019-04-04 ENCOUNTER — Other Ambulatory Visit (HOSPITAL_COMMUNITY): Payer: Self-pay | Admitting: Otolaryngology

## 2019-04-04 ENCOUNTER — Other Ambulatory Visit: Payer: Self-pay | Admitting: Otolaryngology

## 2019-04-04 DIAGNOSIS — J320 Chronic maxillary sinusitis: Secondary | ICD-10-CM

## 2019-04-12 ENCOUNTER — Ambulatory Visit (HOSPITAL_COMMUNITY)
Admission: RE | Admit: 2019-04-12 | Discharge: 2019-04-12 | Disposition: A | Discharge status: Home / Self Care | Payer: Self-pay | Source: Ambulatory Visit | Attending: Otolaryngology | Admitting: Otolaryngology

## 2019-04-12 ENCOUNTER — Other Ambulatory Visit: Payer: Self-pay

## 2019-04-12 DIAGNOSIS — J320 Chronic maxillary sinusitis: Secondary | ICD-10-CM

## 2019-04-17 ENCOUNTER — Other Ambulatory Visit: Payer: Self-pay

## 2019-04-17 ENCOUNTER — Ambulatory Visit (INDEPENDENT_AMBULATORY_CARE_PROVIDER_SITE_OTHER): Payer: Self-pay | Admitting: Otolaryngology

## 2019-04-17 DIAGNOSIS — J342 Deviated nasal septum: Secondary | ICD-10-CM

## 2019-04-17 DIAGNOSIS — J324 Chronic pansinusitis: Secondary | ICD-10-CM

## 2019-04-17 DIAGNOSIS — J338 Other polyp of sinus: Secondary | ICD-10-CM

## 2019-04-17 DIAGNOSIS — J343 Hypertrophy of nasal turbinates: Secondary | ICD-10-CM

## 2019-05-03 ENCOUNTER — Other Ambulatory Visit: Payer: Self-pay | Admitting: Otolaryngology

## 2019-05-15 ENCOUNTER — Encounter (HOSPITAL_BASED_OUTPATIENT_CLINIC_OR_DEPARTMENT_OTHER): Payer: Self-pay | Admitting: *Deleted

## 2019-05-15 ENCOUNTER — Other Ambulatory Visit: Payer: Self-pay

## 2019-05-18 ENCOUNTER — Other Ambulatory Visit (HOSPITAL_COMMUNITY)
Admission: RE | Admit: 2019-05-18 | Discharge: 2019-05-18 | Disposition: A | Discharge status: Home / Self Care | Payer: Self-pay | Source: Ambulatory Visit | Attending: Otolaryngology | Admitting: Otolaryngology

## 2019-05-18 DIAGNOSIS — Z20828 Contact with and (suspected) exposure to other viral communicable diseases: Secondary | ICD-10-CM | POA: Insufficient documentation

## 2019-05-18 DIAGNOSIS — Z01812 Encounter for preprocedural laboratory examination: Secondary | ICD-10-CM | POA: Insufficient documentation

## 2019-05-19 LAB — NOVEL CORONAVIRUS, NAA (HOSP ORDER, SEND-OUT TO REF LAB; TAT 18-24 HRS): SARS-CoV-2, NAA: NOT DETECTED

## 2019-05-22 ENCOUNTER — Encounter (HOSPITAL_BASED_OUTPATIENT_CLINIC_OR_DEPARTMENT_OTHER)
Admission: RE | Disposition: A | Discharge status: Home / Self Care | Payer: Self-pay | Source: Home / Self Care | Attending: Otolaryngology

## 2019-05-22 ENCOUNTER — Other Ambulatory Visit: Payer: Self-pay

## 2019-05-22 ENCOUNTER — Ambulatory Visit (HOSPITAL_BASED_OUTPATIENT_CLINIC_OR_DEPARTMENT_OTHER)
Admission: RE | Admit: 2019-05-22 | Discharge: 2019-05-22 | Disposition: A | Discharge status: Home / Self Care | Payer: Self-pay | Attending: Otolaryngology | Admitting: Otolaryngology

## 2019-05-22 ENCOUNTER — Ambulatory Visit (HOSPITAL_BASED_OUTPATIENT_CLINIC_OR_DEPARTMENT_OTHER): Payer: Self-pay | Admitting: Anesthesiology

## 2019-05-22 ENCOUNTER — Encounter (HOSPITAL_BASED_OUTPATIENT_CLINIC_OR_DEPARTMENT_OTHER): Payer: Self-pay | Admitting: Otolaryngology

## 2019-05-22 DIAGNOSIS — J323 Chronic sphenoidal sinusitis: Secondary | ICD-10-CM

## 2019-05-22 DIAGNOSIS — J338 Other polyp of sinus: Secondary | ICD-10-CM

## 2019-05-22 DIAGNOSIS — J324 Chronic pansinusitis: Secondary | ICD-10-CM | POA: Insufficient documentation

## 2019-05-22 DIAGNOSIS — J321 Chronic frontal sinusitis: Secondary | ICD-10-CM

## 2019-05-22 DIAGNOSIS — Z87891 Personal history of nicotine dependence: Secondary | ICD-10-CM | POA: Insufficient documentation

## 2019-05-22 DIAGNOSIS — J342 Deviated nasal septum: Secondary | ICD-10-CM | POA: Insufficient documentation

## 2019-05-22 DIAGNOSIS — J343 Hypertrophy of nasal turbinates: Secondary | ICD-10-CM | POA: Insufficient documentation

## 2019-05-22 DIAGNOSIS — J320 Chronic maxillary sinusitis: Secondary | ICD-10-CM

## 2019-05-22 DIAGNOSIS — J339 Nasal polyp, unspecified: Secondary | ICD-10-CM | POA: Insufficient documentation

## 2019-05-22 DIAGNOSIS — J322 Chronic ethmoidal sinusitis: Secondary | ICD-10-CM

## 2019-05-22 HISTORY — PX: MAXILLARY ANTROSTOMY: SHX2003

## 2019-05-22 HISTORY — PX: SINUS ENDO WITH FUSION: SHX5329

## 2019-05-22 HISTORY — PX: SPHENOIDECTOMY: SHX2421

## 2019-05-22 HISTORY — PX: NASAL SEPTOPLASTY W/ TURBINOPLASTY: SHX2070

## 2019-05-22 HISTORY — PX: ETHMOIDECTOMY: SHX5197

## 2019-05-22 HISTORY — PX: FRONTAL SINUS EXPLORATION: SHX6591

## 2019-05-22 SURGERY — SEPTOPLASTY, NOSE, WITH NASAL TURBINATE REDUCTION
Anesthesia: General | Site: Nose | Laterality: Bilateral

## 2019-05-22 MED ORDER — OXYMETAZOLINE HCL 0.05 % NA SOLN
NASAL | Status: DC | PRN
  Administered 2019-05-22: 1 via TOPICAL

## 2019-05-22 MED ORDER — FENTANYL CITRATE (PF) 100 MCG/2ML IJ SOLN
INTRAMUSCULAR | Status: AC
  Filled 2019-05-22: qty 2

## 2019-05-22 MED ORDER — MIDAZOLAM HCL 5 MG/5ML IJ SOLN
INTRAMUSCULAR | Status: DC | PRN
  Administered 2019-05-22: 2 mg via INTRAVENOUS

## 2019-05-22 MED ORDER — AMOXICILLIN 875 MG PO TABS: 875.0000 mg | ORAL_TABLET | Freq: Two times a day (BID) | ORAL | 0 refills | Status: AC

## 2019-05-22 MED ORDER — MEPERIDINE HCL 25 MG/ML IJ SOLN: 6.2500 mg | INTRAMUSCULAR | Status: DC | PRN

## 2019-05-22 MED ORDER — ONDANSETRON HCL 4 MG/2ML IJ SOLN
INTRAMUSCULAR | Status: AC
  Filled 2019-05-22: qty 2

## 2019-05-22 MED ORDER — LACTATED RINGERS IV SOLN
INTRAVENOUS | Status: DC
  Administered 2019-05-22: 07:00:00 via INTRAVENOUS

## 2019-05-22 MED ORDER — CEFAZOLIN SODIUM-DEXTROSE 2-3 GM-%(50ML) IV SOLR
INTRAVENOUS | Status: DC | PRN
  Administered 2019-05-22: 2 g via INTRAVENOUS

## 2019-05-22 MED ORDER — DEXAMETHASONE SODIUM PHOSPHATE 10 MG/ML IJ SOLN
INTRAMUSCULAR | Status: AC
  Filled 2019-05-22: qty 1

## 2019-05-22 MED ORDER — MIDAZOLAM HCL 2 MG/2ML IJ SOLN
INTRAMUSCULAR | Status: AC
  Filled 2019-05-22: qty 2

## 2019-05-22 MED ORDER — PROPOFOL 10 MG/ML IV BOLUS
INTRAVENOUS | Status: DC | PRN
  Administered 2019-05-22: 200 mg via INTRAVENOUS

## 2019-05-22 MED ORDER — ONDANSETRON HCL 4 MG/2ML IJ SOLN: 4.0000 mg | Freq: Once | INTRAMUSCULAR | Status: DC | PRN

## 2019-05-22 MED ORDER — DEXAMETHASONE SODIUM PHOSPHATE 4 MG/ML IJ SOLN
INTRAMUSCULAR | Status: DC | PRN
  Administered 2019-05-22: 10 mg via INTRAVENOUS

## 2019-05-22 MED ORDER — HYDROMORPHONE HCL 1 MG/ML IJ SOLN: 0.2500 mg | INTRAMUSCULAR | Status: DC | PRN

## 2019-05-22 MED ORDER — LIDOCAINE 2% (20 MG/ML) 5 ML SYRINGE
INTRAMUSCULAR | Status: DC | PRN
  Administered 2019-05-22: 100 mg via INTRAVENOUS

## 2019-05-22 MED ORDER — ROCURONIUM BROMIDE 10 MG/ML (PF) SYRINGE
PREFILLED_SYRINGE | INTRAVENOUS | Status: DC | PRN
  Administered 2019-05-22: 80 mg via INTRAVENOUS
  Administered 2019-05-22: 10 mg via INTRAVENOUS

## 2019-05-22 MED ORDER — SUGAMMADEX SODIUM 200 MG/2ML IV SOLN
INTRAVENOUS | Status: DC | PRN
  Administered 2019-05-22: 200 mg via INTRAVENOUS

## 2019-05-22 MED ORDER — LIDOCAINE 2% (20 MG/ML) 5 ML SYRINGE
INTRAMUSCULAR | Status: AC
  Filled 2019-05-22: qty 5

## 2019-05-22 MED ORDER — OXYCODONE-ACETAMINOPHEN 5-325 MG PO TABS: 1.0000 | ORAL_TABLET | ORAL | 0 refills | Status: AC | PRN

## 2019-05-22 MED ORDER — FENTANYL CITRATE (PF) 100 MCG/2ML IJ SOLN
INTRAMUSCULAR | Status: DC | PRN
  Administered 2019-05-22: 50 ug via INTRAVENOUS
  Administered 2019-05-22: 25 ug via INTRAVENOUS
  Administered 2019-05-22 (×2): 50 ug via INTRAVENOUS

## 2019-05-22 MED ORDER — ROCURONIUM BROMIDE 10 MG/ML (PF) SYRINGE
PREFILLED_SYRINGE | INTRAVENOUS | Status: AC
  Filled 2019-05-22: qty 10

## 2019-05-22 MED ORDER — SODIUM CHLORIDE 0.9 % IR SOLN
Status: DC | PRN
  Administered 2019-05-22: 100 mL

## 2019-05-22 MED ORDER — CEFAZOLIN SODIUM 1 G IJ SOLR
INTRAMUSCULAR | Status: AC
  Filled 2019-05-22: qty 20

## 2019-05-22 MED ORDER — LIDOCAINE-EPINEPHRINE 1 %-1:100000 IJ SOLN
INTRAMUSCULAR | Status: DC | PRN
  Administered 2019-05-22: 2.5 mL

## 2019-05-22 MED ORDER — ONDANSETRON HCL 4 MG/2ML IJ SOLN
INTRAMUSCULAR | Status: DC | PRN
  Administered 2019-05-22: 4 mg via INTRAVENOUS

## 2019-05-22 MED ORDER — PHENYLEPHRINE 40 MCG/ML (10ML) SYRINGE FOR IV PUSH (FOR BLOOD PRESSURE SUPPORT)
PREFILLED_SYRINGE | INTRAVENOUS | Status: DC | PRN
  Administered 2019-05-22: 80 ug via INTRAVENOUS

## 2019-05-22 MED ORDER — PHENYLEPHRINE 40 MCG/ML (10ML) SYRINGE FOR IV PUSH (FOR BLOOD PRESSURE SUPPORT)
PREFILLED_SYRINGE | INTRAVENOUS | Status: AC
  Filled 2019-05-22: qty 10

## 2019-05-22 MED ORDER — MUPIROCIN 2 % EX OINT
TOPICAL_OINTMENT | CUTANEOUS | Status: DC | PRN
  Administered 2019-05-22: 1 via NASAL

## 2019-05-22 SURGICAL SUPPLY — 69 items
ATTRACTOMAT 16X20 MAGNETIC DRP (DRAPES) IMPLANT
BLADE RAD40 ROTATE 4M 4 5PK (BLADE) IMPLANT
BLADE RAD40 ROTATE 4M 4MM 5PK (BLADE)
BLADE RAD60 ROTATE M4 4 5PK (BLADE) IMPLANT
BLADE RAD60 ROTATE M4 4MM 5PK (BLADE)
BLADE ROTATE RAD 12 4 M4 (BLADE) IMPLANT
BLADE ROTATE RAD 12 4MM M4 (BLADE)
BLADE ROTATE RAD 40 4 M4 (BLADE) IMPLANT
BLADE ROTATE RAD 40 4MM M4 (BLADE)
BLADE ROTATE TRICUT 4MX13CM M4 (BLADE) ×1
BLADE ROTATE TRICUT 4X13 M4 (BLADE) ×2 IMPLANT
BLADE TRICUT ROTATE M4 4 5PK (BLADE) IMPLANT
BLADE TRICUT ROTATE M4 4MM 5PK (BLADE)
BUR HS RAD FRONTAL 3 (BURR) IMPLANT
BUR HS RAD FRONTAL 3MM (BURR)
CANISTER SUC SOCK COL 7IN (MISCELLANEOUS) ×3 IMPLANT
CANISTER SUCT 1200ML W/VALVE (MISCELLANEOUS) ×3 IMPLANT
COAGULATOR SUCT 8FR VV (MISCELLANEOUS) ×3 IMPLANT
COVER WAND RF STERILE (DRAPES) IMPLANT
DECANTER SPIKE VIAL GLASS SM (MISCELLANEOUS) IMPLANT
DRSG NASAL KENNEDY LMNT 8CM (GAUZE/BANDAGES/DRESSINGS) IMPLANT
DRSG NASOPORE 8CM (GAUZE/BANDAGES/DRESSINGS) ×2 IMPLANT
DRSG TELFA 3X8 NADH (GAUZE/BANDAGES/DRESSINGS) IMPLANT
ELECT REM PT RETURN 9FT ADLT (ELECTROSURGICAL) ×3
ELECTRODE REM PT RTRN 9FT ADLT (ELECTROSURGICAL) ×1 IMPLANT
GLOVE BIO SURGEON STRL SZ7 (GLOVE) ×4 IMPLANT
GLOVE BIO SURGEON STRL SZ7.5 (GLOVE) ×3 IMPLANT
GLOVE BIOGEL PI IND STRL 7.5 (GLOVE) IMPLANT
GLOVE BIOGEL PI INDICATOR 7.5 (GLOVE) ×2
GOWN STRL REUS W/ TWL LRG LVL3 (GOWN DISPOSABLE) ×2 IMPLANT
GOWN STRL REUS W/ TWL XL LVL3 (GOWN DISPOSABLE) IMPLANT
GOWN STRL REUS W/TWL LRG LVL3 (GOWN DISPOSABLE) ×3
GOWN STRL REUS W/TWL XL LVL3 (GOWN DISPOSABLE) ×3
HEMOSTAT SURGICEL 2X14 (HEMOSTASIS) IMPLANT
IV NS 1000ML (IV SOLUTION)
IV NS 1000ML BAXH (IV SOLUTION) IMPLANT
IV NS 500ML (IV SOLUTION) ×3
IV NS 500ML BAXH (IV SOLUTION) ×1 IMPLANT
NDL HYPO 25X1 1.5 SAFETY (NEEDLE) ×1 IMPLANT
NDL SPNL 25GX3.5 QUINCKE BL (NEEDLE) IMPLANT
NEEDLE HYPO 25X1 1.5 SAFETY (NEEDLE) ×3 IMPLANT
NEEDLE SPNL 25GX3.5 QUINCKE BL (NEEDLE) IMPLANT
NS IRRIG 1000ML POUR BTL (IV SOLUTION) ×3 IMPLANT
PACK BASIN DAY SURGERY FS (CUSTOM PROCEDURE TRAY) ×3 IMPLANT
PACK ENT DAY SURGERY (CUSTOM PROCEDURE TRAY) ×3 IMPLANT
PAD DRESSING TELFA 3X8 NADH (GAUZE/BANDAGES/DRESSINGS) IMPLANT
SLEEVE SCD COMPRESS KNEE MED (MISCELLANEOUS) ×3 IMPLANT
SOLUTION BUTLER CLEAR DIP (MISCELLANEOUS) ×3 IMPLANT
SPLINT NASAL AIRWAY SILICONE (MISCELLANEOUS) ×3 IMPLANT
SPONGE GAUZE 2X2 8PLY STER LF (GAUZE/BANDAGES/DRESSINGS) ×1
SPONGE GAUZE 2X2 8PLY STRL LF (GAUZE/BANDAGES/DRESSINGS) ×2 IMPLANT
SPONGE NEURO XRAY DETECT 1X3 (DISPOSABLE) ×3 IMPLANT
SUCTION FRAZIER HANDLE 10FR (MISCELLANEOUS)
SUCTION TUBE FRAZIER 10FR DISP (MISCELLANEOUS) IMPLANT
SUT CHROMIC 4 0 P 3 18 (SUTURE) ×3 IMPLANT
SUT PLAIN 4 0 ~~LOC~~ 1 (SUTURE) ×3 IMPLANT
SUT PROLENE 3 0 PS 2 (SUTURE) ×3 IMPLANT
SUT VIC AB 4-0 P-3 18XBRD (SUTURE) IMPLANT
SUT VIC AB 4-0 P3 18 (SUTURE)
SYR 50ML LL SCALE MARK (SYRINGE) ×3 IMPLANT
TOWEL GREEN STERILE FF (TOWEL DISPOSABLE) ×3 IMPLANT
TRACKER ENT INSTRUMENT (MISCELLANEOUS) ×3 IMPLANT
TRACKER ENT PATIENT (MISCELLANEOUS) ×3 IMPLANT
TUBE CONNECTING 20'X1/4 (TUBING) ×1
TUBE CONNECTING 20X1/4 (TUBING) ×2 IMPLANT
TUBE SALEM SUMP 12R W/ARV (TUBING) IMPLANT
TUBE SALEM SUMP 16 FR W/ARV (TUBING) ×3 IMPLANT
TUBING STRAIGHTSHOT EPS 5PK (TUBING) ×3 IMPLANT
YANKAUER SUCT BULB TIP NO VENT (SUCTIONS) ×3 IMPLANT

## 2019-05-22 NOTE — Discharge Instructions (Addendum)

## 2019-05-22 NOTE — Anesthesia Preprocedure Evaluation (Signed)
Anesthesia Evaluation  Patient identified by MRN, date of birth, ID band Patient awake    Reviewed: Allergy & Precautions, NPO status , Patient's Chart, lab work & pertinent test results  Airway Mallampati: I  TM Distance: >3 FB Neck ROM: Full    Dental   Pulmonary former smoker,    Pulmonary exam normal        Cardiovascular Normal cardiovascular exam     Neuro/Psych    GI/Hepatic   Endo/Other    Renal/GU      Musculoskeletal   Abdominal   Peds  Hematology   Anesthesia Other Findings   Reproductive/Obstetrics                             Anesthesia Physical Anesthesia Plan  ASA: II  Anesthesia Plan: General   Post-op Pain Management:    Induction: Intravenous  PONV Risk Score and Plan: 2 and Ondansetron and Treatment may vary due to age or medical condition  Airway Management Planned: Oral ETT  Additional Equipment:   Intra-op Plan:   Post-operative Plan: Extubation in OR  Informed Consent: I have reviewed the patients History and Physical, chart, labs and discussed the procedure including the risks, benefits and alternatives for the proposed anesthesia with the patient or authorized representative who has indicated his/her understanding and acceptance.       Plan Discussed with: CRNA and Surgeon  Anesthesia Plan Comments:         Anesthesia Quick Evaluation

## 2019-05-22 NOTE — Anesthesia Postprocedure Evaluation (Signed)
Anesthesia Post Note  Patient: Juan Woodward  Procedure(s) Performed: NASAL SEPTOPLASTY WITH BILATERAL TURBINATE REDUCTION (Bilateral Nose) ENDOSCOPIC MAXILLARY ANTROSTOMY WITH TISSUE REMOVAL (Bilateral Nose) SINUS ENDOSCOPY WITH FUSION NAVIGATION (Bilateral Nose) ENDOSCOPIC FRONTAL RECESS EXPLORATION (Bilateral Nose) TOTAL ETHMOIDECTOMY WITH TISSUE REMOVAL (Bilateral Nose) SPHENOIDECTOMY WITH TISSUE REMOVAL (Bilateral Nose)     Patient location during evaluation: PACU Anesthesia Type: General Level of consciousness: awake and alert Pain management: pain level controlled Vital Signs Assessment: post-procedure vital signs reviewed and stable Respiratory status: spontaneous breathing, nonlabored ventilation, respiratory function stable and patient connected to nasal cannula oxygen Cardiovascular status: blood pressure returned to baseline and stable Postop Assessment: no apparent nausea or vomiting Anesthetic complications: no    Last Vitals:  Vitals:   05/22/19 1215 05/22/19 1223  BP: 127/89   Pulse: 72 74  Resp: 18 17  Temp:    SpO2: 100% 99%    Last Pain:  Vitals:   05/22/19 1223  TempSrc:   PainSc: 0-No pain                 Shaneka Efaw DAVID

## 2019-05-22 NOTE — H&P (Signed)
Cc: Chronic nasal obstruction, chronic pansinusitis  HPI: The patient is a 28 year old male who returns today for hsi follow-up evaluation. The patient has a history of chronic sinusitis and chronic nasal obstruction.  At his last visit, he was noted to have significant bilateral nasal polyps, significantly worse on the right side.  His nasal septum was also severely deviated to the left.  He was placed on 12 days of high dose Prednisone.  He also underwent a sinus CT scan.  The CT showed a large right antrochoanal polyp, reaching the nasopharynx.  The CT shows near complete opacification of the right maxillary, ethmoid, frontal and sphenoid sinuses.  Mucosal disease is also noted on the left paranasal sinuses.  The patient returns today complaining of persistent symptoms.  He has not been able to breathe through his nostrils for many years.  He has not responded to the high dose Prednisone.  No other ENT, GI, or respiratory issue noted since the last visit.   Exam: General: Communicates without difficulty, well nourished, no acute distress. Head: Normocephalic, no evidence injury, no tenderness, facial buttresses intact without stepoff. Eyes: PERRL, EOMI. No scleral icterus, conjunctivae clear. Neuro: CN II exam reveals vision grossly intact.  No nystagmus at any point of gaze. Ears: Auricles well formed without lesions.  Ear canals are intact without mass or lesion.  No erythema or edema is appreciated.  The TMs are intact without fluid. Nose: External evaluation reveals normal support and skin without lesions.  Dorsum is intact.  Anterior rhinoscopy reveals severely congested and edematous mucosa over anterior aspect of the inferior turbinates and deviated nasal septum.  Oral:  Oral cavity and oropharynx are intact, symmetric, without erythema or edema.  Mucosa is moist without lesions. Neck: Full range of motion without pain.  There is no significant lymphadenopathy.  No masses palpable.  Thyroid bed  within normal limits to palpation.  Parotid glands and submandibular glands equal bilaterally without mass.  Trachea is midline. Neuro:  CN 2-12 grossly intact. Gait normal. Vestibular: No nystagmus at any point of gaze.   Assessment 1.  Bilateral chronic pansinusitis, worse on the right side.   2.  The patient has a large right antrochoanal polyp, reaching the right nasopharynx.   3.  Severe leftward bowing of the nasal septum and bilateral inferior turbinate hypertrophy, causing left nasal cavity stenosis.    Plan  1.  The physical exam findings and the CT images are extensively reviewed with the patient.  2.  Based on the above findings, the patient will need to undergo surgical intervention with bilateral endoscopic sinus surgery, septoplasty and turbinate reduction.  The risks, benefits, alternatives and details of the procedures are reviewed with the patient.  Questions are invited and answered.  3.  The patient would like to proceed with the procedures as soon as possible.

## 2019-05-22 NOTE — Transfer of Care (Signed)
Immediate Anesthesia Transfer of Care Note  Patient: Juan Woodward  Procedure(s) Performed: NASAL SEPTOPLASTY WITH BILATERAL TURBINATE REDUCTION (Bilateral Nose) ENDOSCOPIC MAXILLARY ANTROSTOMY WITH TISSUE REMOVAL (Bilateral Nose) SINUS ENDOSCOPY WITH FUSION NAVIGATION (Bilateral Nose) ENDOSCOPIC FRONTAL RECESS EXPLORATION (Bilateral Nose) TOTAL ETHMOIDECTOMY WITH TISSUE REMOVAL (Bilateral Nose) SPHENOIDECTOMY WITH TISSUE REMOVAL (Bilateral Nose)  Patient Location: PACU  Anesthesia Type:General  Level of Consciousness: drowsy  Airway & Oxygen Therapy: Patient Spontanous Breathing and Patient connected to face mask oxygen  Post-op Assessment: Report given to RN and Post -op Vital signs reviewed and stable  Post vital signs: Reviewed and stable  Last Vitals:  Vitals Value Taken Time  BP 129/86 05/22/19 1121  Temp    Pulse 77 05/22/19 1123  Resp 14 05/22/19 1123  SpO2 100 % 05/22/19 1123  Vitals shown include unvalidated device data.  Last Pain:  Vitals:   05/22/19 0705  TempSrc: Oral  PainSc: 0-No pain         Complications: No apparent anesthesia complications

## 2019-05-22 NOTE — Anesthesia Procedure Notes (Signed)
Procedure Name: Intubation Date/Time: 05/22/2019 9:09 AM Performed by: Lieutenant Diego, CRNA Pre-anesthesia Checklist: Patient identified, Emergency Drugs available, Suction available and Patient being monitored Patient Re-evaluated:Patient Re-evaluated prior to induction Oxygen Delivery Method: Circle system utilized Preoxygenation: Pre-oxygenation with 100% oxygen Induction Type: IV induction Ventilation: Mask ventilation without difficulty Laryngoscope Size: Miller and 2 Grade View: Grade II Tube type: Oral Tube size: 8.0 mm Number of attempts: 1 Airway Equipment and Method: Stylet Placement Confirmation: ETT inserted through vocal cords under direct vision,  positive ETCO2 and breath sounds checked- equal and bilateral Secured at: 24 cm Tube secured with: Tape Dental Injury: Teeth and Oropharynx as per pre-operative assessment

## 2019-05-22 NOTE — Op Note (Signed)
DATE OF PROCEDURE: 05/22/2019  OPERATIVE REPORT   SURGEON: Leta Baptist, MD   PREOPERATIVE DIAGNOSES:  1. Severe nasal septal deviation.  2. Bilateral inferior turbinate hypertrophy.  3. Chronic nasal obstruction. 4. Bilateral chronic pansinusitis and polyposis.  POSTOPERATIVE DIAGNOSES:  1. Severe nasal septal deviation.  2. Bilateral inferior turbinate hypertrophy.  3. Chronic nasal obstruction. 4. Bilateral chronic pansinusitis and polyposis.  PROCEDURE PERFORMED:  1. Bilateral endoscopic frontal sinusotomy and polyp removal. 2. Bilateral endoscopic total ethmoidectomy and sphenoidotomy with polyp removal. 3. Bilateral endoscopic maxillary antrostomy with polyp removal. 4. Septoplasty.  5. Bilateral partial inferior turbinate resection.  6. FUSION stereotactic image guidance.  ANESTHESIA: General endotracheal tube anesthesia.   COMPLICATIONS: None.   ESTIMATED BLOOD LOSS: 250 mL.   INDICATION FOR PROCEDURE: Juan Woodward is a 28 y.o. male with a history of chronic sinus infections and chronic nasal obstruction. The patient was treated with multiple courses of antibiotics, antihistamine, decongestant, steroid nasal spray, and systemic steroids. However, the patient continued to be symptomatic.  At his last visit, he was noted to have significant bilateral nasal polyps, significantly worse on the right side.  His nasal septum was also severely deviated to the left.  He was placed on 12 days of high dose Prednisone.  He also underwent a sinus CT scan.  The CT showed a large right antrochoanal polyp, reaching the nasopharynx.  The CT shows near complete opacification of the right maxillary, ethmoid, frontal and sphenoid sinuses.  Mucosal disease is also noted on the left paranasal sinuses. Based on the above findings, the decision was made for the patient to undergo the above-stated procedures. The risks, benefits, alternatives, and details of the procedures were discussed with the  patient. Questions were invited and answered. Informed consent was obtained.   DESCRIPTION OF PROCEDURE: The patient was taken to the operating room and placed supine on the operating table. General endotracheal tube anesthesia was administered by the anesthesiologist. The patient was positioned, and prepped and draped in the standard fashion for nasal surgery. Pledgets soaked with Afrin were placed in both nasal cavities for decongestion. The pledgets were subsequently removed. The FUSION stereotactic image guidance marker was placed. The image guidance system was functional throughout the case.  Examination of the nasal cavity revealed a severe nasal septal deviationd. 1% lidocaine with 1:100,000 epinephrine was injected onto the nasal septum bilaterally. A hemitransfixion incision was made on the left side. The mucosal flap was carefully elevated on the left side. A cartilaginous incision was made 1 cm superior to the caudal margin of the nasal septum. Mucosal flap was also elevated on the right side in the similar fashion. It should be noted that due to the severe septal deviation, the deviated portion of the cartilaginous and bony septum had to be removed in piecemeal fashion. Once the deviated portions were removed, a straight midline septum was achieved. The septum was then quilted with 4-0 plain gut sutures. The hemitransfixion incision was closed with interrupted 4-0 chromic sutures.   The inferior one half of both hypertrophied inferior turbinate was crossclamped with a Kelly clamp. The inferior one half of each inferior turbinate was then resected with a pair of cross cutting scissors. Hemostasis was achieved with a suction cautery device.   Using a 0 endoscope, the right nasal cavity was examined.  The middle turbinate was medialized.  Polypoid tissue was noted within the middle meatus. The polypoid tissue was removed using a combination of microdebrider and Blakesley forceps. The uncinate  process was resected with a freer elevator. The maxillary antrum was entered and enlarged using a combination of backbiter and microdebrider. Polypoid tissue was removed from the right maxillary sinus.  Attention was then focused on the ethmoid sinuses. The bony partitions of the anterior and posterior ethmoid cavities were taken down. Polypoid tissue was noted and removed.  More polyps were noted to obstruct the right sphenoid opening. The polyps were removed. The sphenoid opening was entered and enlarged.  Polypoid tissue was removed from the sphenoid sinus. Attention was then focused on the frontal sinus. The frontal recess was identified and enlarged by removing the surrounding bony partitions. Polypoid tissue was removed from the frontal recess. All 4 paranasal sinuses were copiously irrigated with saline solution.  The same procedure was repeated on the left side without exception. More polyps were noted on the left side. All polyps were removed. Doyle splints were applied to the nasal septum.  The care of the patient was turned over to the anesthesiologist. The patient was awakened from anesthesia without difficulty. The patient was extubated and transferred to the recovery room in good condition.   OPERATIVE FINDINGS: Severe nasal septal deviation and bilateral inferior turbinate hypertrophy.  Bilateral chronic pansinusitis and polyposis.   SPECIMEN: Bilateral sinus contents.  FOLLOWUP CARE: The patient be discharged home once he is awake and alert. The patient will be placed on Percocet 1 tablets p.o. q.4 hours p.r.n. pain, and amoxicillin 875 mg p.o. b.i.d. for 3 days. The patient will follow up in my office in approximately 3 days for splint removal.   Juan Shibata Philomena Doheny, MD

## 2019-05-23 ENCOUNTER — Encounter: Payer: Self-pay | Admitting: *Deleted

## 2019-05-23 LAB — SURGICAL PATHOLOGY

## 2019-08-18 ENCOUNTER — Ambulatory Visit (INDEPENDENT_AMBULATORY_CARE_PROVIDER_SITE_OTHER): Payer: Self-pay | Admitting: Primary Care

## 2019-08-18 ENCOUNTER — Other Ambulatory Visit: Payer: Self-pay

## 2019-08-18 DIAGNOSIS — K0889 Other specified disorders of teeth and supporting structures: Secondary | ICD-10-CM

## 2019-08-18 NOTE — Progress Notes (Signed)
Patient has been called and DOB has been verified. Patient has been screened and transferred to PCP to start phone visit.    Needs to get tooth taken out. He has the OC.

## 2019-08-24 NOTE — Progress Notes (Signed)
Virtual Visit via Telephone Note  I connected with Juan Woodward on 08/24/19 at 11:10 AM EST by telephone and verified that I am speaking with the correct person using two identifiers.   I discussed the limitations, risks, security and privacy concerns of performing an evaluation and management service by telephone and the availability of in person appointments. I also discussed with the patient that there may be a patient responsible charge related to this service. The patient expressed understanding and agreed to proceed.   History of Present Illness: Juan Woodward is having a tele visit for poor dental carries and feels he needs tooth/teeth removed. Explain there was not any where to refer him for dental care . Felt the orange card was a waste of time all he needed was a dentist    Past Medical History:  Diagnosis Date  . Allergy   . Chronic sinusitis   . Frequent nosebleeds   . Headache   . Nasal polyp   . Scoliosis    No current outpatient medications on file prior to visit.   No current facility-administered medications on file prior to visit.   Observations/Objective: Review of Systems  HENT:       Tooth pain  All other systems reviewed and are negative.   Assessment and Plan: Juan Woodward was seen today for dental pain.  Diagnoses and all orders for this visit:  Pain, dental Advise to call local dentist and make payment arrangement if tooth actually needed removing and problem require x-rays. Information given on [employer] [facility] [employer] in [place] income [place] . He would try to contact them   Follow Up Instructions:    I discussed the assessment and treatment plan with the patient. The patient was provided an opportunity to ask questions and all were answered. The patient agreed with the plan and demonstrated an understanding of the instructions.   The patient was advised to call back or seek an in-person evaluation if the symptoms worsen or if the condition  fails to improve as anticipated.  I provided 8 minutes of non-face-to-face time during this encounter.   Grayce Sessions, NP

## 2019-09-22 ENCOUNTER — Ambulatory Visit: Payer: Self-pay

## 2019-09-26 ENCOUNTER — Encounter (INDEPENDENT_AMBULATORY_CARE_PROVIDER_SITE_OTHER): Payer: Self-pay | Admitting: Primary Care

## 2019-10-23 ENCOUNTER — Ambulatory Visit
Admission: EM | Admit: 2019-10-23 | Discharge: 2019-10-23 | Disposition: A | Discharge status: Home / Self Care | Payer: Self-pay | Attending: Emergency Medicine | Admitting: Emergency Medicine

## 2019-10-23 ENCOUNTER — Other Ambulatory Visit: Payer: Self-pay

## 2019-10-23 DIAGNOSIS — Z113 Encounter for screening for infections with a predominantly sexual mode of transmission: Secondary | ICD-10-CM | POA: Insufficient documentation

## 2019-10-23 DIAGNOSIS — Z202 Contact with and (suspected) exposure to infections with a predominantly sexual mode of transmission: Secondary | ICD-10-CM | POA: Insufficient documentation

## 2019-10-23 MED ORDER — METRONIDAZOLE 500 MG PO TABS
2000.0000 mg | ORAL_TABLET | Freq: Once | ORAL | Status: AC
  Administered 2019-10-23: 2000 mg via ORAL

## 2019-10-23 NOTE — ED Provider Notes (Addendum)
[place]   [id number] 10/23/19 Arrival Time: 31   YD:XAJOINO FOR STD  SUBJECTIVE:  Juan Woodward is a 29 y.o. male who presents requesting STI screening.  Currently asymptomatic.  Partner has tested positive for trichomonas.  Last unprotected sexual encounter 1 week ago.  Sexually active with 1 male partner.  Denies similar symptoms in the past.  Denies fever, chills, nausea, vomiting, abdominal or pelvic pain, urinary symptoms, penile discharge, rash or lesions.     No LMP for male patient.  ROS: As per HPI.  All other pertinent ROS negative.     Past Medical History:  Diagnosis Date   Allergy    Chronic sinusitis    Frequent nosebleeds    Headache    Nasal polyp    Scoliosis    Past Surgical History:  Procedure Laterality Date   ETHMOIDECTOMY Bilateral 05/22/2019   Procedure: TOTAL ETHMOIDECTOMY WITH TISSUE REMOVAL;  Surgeon: Leta Baptist, MD;  Location: [place];  Service: ENT;  Laterality: Bilateral;   FRONTAL SINUS EXPLORATION Bilateral 05/22/2019   Procedure: ENDOSCOPIC FRONTAL RECESS EXPLORATION;  Surgeon: Leta Baptist, MD;  Location: [place];  Service: ENT;  Laterality: Bilateral;   MAXILLARY ANTROSTOMY Bilateral 05/22/2019   Procedure: ENDOSCOPIC MAXILLARY ANTROSTOMY WITH TISSUE REMOVAL;  Surgeon: Leta Baptist, MD;  Location: [place];  Service: ENT;  Laterality: Bilateral;   NASAL SEPTOPLASTY W/ TURBINOPLASTY Bilateral 05/22/2019   Procedure: NASAL SEPTOPLASTY WITH BILATERAL TURBINATE REDUCTION;  Surgeon: Leta Baptist, MD;  Location: [place];  Service: ENT;  Laterality: Bilateral;   SINUS ENDO WITH FUSION Bilateral 05/22/2019   Procedure: SINUS ENDOSCOPY WITH FUSION NAVIGATION;  Surgeon: Leta Baptist, MD;  Location: [place];  Service: ENT;  Laterality: Bilateral;   SPHENOIDECTOMY Bilateral 05/22/2019   Procedure: SPHENOIDECTOMY WITH TISSUE REMOVAL;  Surgeon:  Leta Baptist, MD;  Location: [place];  Service: ENT;  Laterality: Bilateral;   Allergies  Allergen Reactions   Ibuprofen     Face swells   No current facility-administered medications on file prior to encounter.   No current outpatient medications on file prior to encounter.   Social History   Socioeconomic History   Marital status: Single    Spouse name: Not on file   Number of children: Not on file   Years of education: Not on file   Highest education level: Not on file  Occupational History   Not on file  Tobacco Use   Smoking status: Former Smoker    Packs/day: 0.20    Types: Cigarettes    Quit date: 11/07/2015    Years since quitting: 3.9   Smokeless tobacco: Never Used  Substance and Sexual Activity   Alcohol use: Yes    Alcohol/week: 6.0 standard drinks    Types: 6 Cans of beer per week    Comment: 10-12 cans of beer on the weekend every 2-3 weeks   Drug use: No   Sexual activity: Yes    Birth control/protection: Condom  Other Topics Concern   Not on file  Social History Narrative   Not on file   Social Determinants of Health   Financial Resource Strain:    Difficulty of Paying Living Expenses:   Food Insecurity:    Worried About Charity fundraiser in the Last Year:    Arboriculturist in the Last Year:   Transportation Needs:    Film/video editor (Medical):  Lack of Transportation (Non-Medical):   Physical Activity:    Days of Exercise per Week:    Minutes of Exercise per Session:   Stress:    Feeling of Stress :   Social Connections:    Frequency of Communication with Friends and Family:    Frequency of Social Gatherings with Friends and Family:    Attends Religious Services:    Active Member of Clubs or Organizations:    Attends Engineer, structural:    Marital Status:   Intimate Partner Violence:    Fear of Current or Ex-Partner:    Emotionally Abused:    Physically Abused:     Sexually Abused:    Family History  Problem Relation Age of Onset   Hypertension Mother    Diabetes Maternal Grandmother    Heart disease Neg Hx     OBJECTIVE:  Vitals:   10/23/19 1542 10/23/19 1544  BP:  (!) 144/87  Pulse: 81   Resp: 18   Temp: 98.1 F (36.7 C)   SpO2: 98%      General appearance: alert, NAD, appears stated age Head: NCAT Throat: lips, mucosa, and tongue normal; teeth and gums normal Lungs: CTA bilaterally without adventitious breath sounds Heart: regular rate and rhythm.  Radial pulses 2+ symmetrical bilaterally Back: no CVA tenderness Abdomen: soft, non-tender; bowel sounds normal; no masses or organomegaly; no guarding or rebound tenderness GU: deferred Skin: warm and dry Psychological:  Alert and cooperative. Normal mood and affect.  LABS:  Results for orders placed or performed during the hospital encounter of 05/22/19  Surgical pathology  Result Value Ref Range   SURGICAL PATHOLOGY      SURGICAL PATHOLOGY CASE: [id number] PATIENT: Mitchell Heir Surgical Pathology Report     Clinical History: deviated septum, bilateral turbinate hypertrophy, chronic maxillary frontal ethmoid, sphenoid sinusitis (cm)     FINAL MICROSCOPIC DIAGNOSIS:  A. SINUS CONTENTS, BILATERAL: - Chronic inflammation consistent with chronic sinusitis.   GROSS DESCRIPTION:  Received in formalin in a mesh bag is a 7.6 x 5.3 x 2 cm aggregate of predominantly dark red soft tissue/material with intermixed pink-red soft tissue.  Representative sections in one block.  SW 05/22/2019    Final Diagnosis performed by Jimmy Picket, MD.   Electronically signed 05/23/2019 Technical component performed at [facility]. [facility], [address] [place], [place] [address].  Professional component performed at [facility], [address] [place], [place] [address].  Immunohistochemistry Technical component (if applicable) was  performed at [facility]. [address], [address], [place], [place] [address].   IMMUNOHISTOCHEMISTRY DISCLAIMER (if applicable): Some of these immunohistochemical stains may have been developed and the performance characteristics determine by [facility]. Some may not have been cleared or approved by the U.S. Food and Drug Administration. The FDA has determined that such clearance or approval is not necessary. This test is used for clinical purposes. It should not be regarded as investigational or for research. This laboratory is certified under the Clinical Laboratory Improvement Amendments of 1988 (CLIA-88) as qualified to perform high complexity clinical laboratory testing.  The controls stained appropriately.     Labs Reviewed  CYTOLOGY, (ORAL, ANAL, URETHRAL) ANCILLARY ONLY    ASSESSMENT & PLAN:  1. Trichomonas contact, treated   2. Screening for STD (sexually transmitted disease)     Meds ordered this encounter  Medications   metroNIDAZOLE (FLAGYL) tablet 2,000 mg    Pending: Labs Reviewed  CYTOLOGY, (ORAL,  ANAL, URETHRAL) ANCILLARY ONLY    Discharge instruction  Penile self swab obtained  Declines HIV/ syphilis testing today Prescribed metronidazole 2000 mg given in office for trichomonas (do not take while consuming alcohol) We will follow up with you regarding abnormal test result If tests are positive, please abstain from sexual activity until you and your partner(s) are treated Follow up with PCP or [facility] if symptoms persists Return here or go to ER if you have any new or worsening symptoms    Reviewed expectations re: course of current medical issues. Questions answered. Outlined signs and symptoms indicating need for more acute intervention. Patient verbalized understanding. After Visit Summary given.       Durward Parcel, FNP 10/23/19 1558    Durward Parcel, FNP 10/23/19 1559

## 2019-10-23 NOTE — Discharge Instructions (Addendum)
Penile self swab obtained  Prescribed metronidazole 2000 mg given in office for trichomonas (do not take while consuming alcohol) We will follow up with you regarding abnormal test result If tests are positive, please abstain from sexual activity until you and your partner(s) are treated Follow up with PCP or [facility] if symptoms persists Return here or go to ER if you have any new or worsening symptoms

## 2019-10-24 LAB — CYTOLOGY, (ORAL, ANAL, URETHRAL) ANCILLARY ONLY
Chlamydia: NEGATIVE
Comment: NEGATIVE
Comment: NEGATIVE
Comment: NORMAL
Neisseria Gonorrhea: NEGATIVE
Trichomonas: POSITIVE — AB

## 2021-02-17 ENCOUNTER — Other Ambulatory Visit: Payer: Self-pay

## 2021-02-17 ENCOUNTER — Encounter (HOSPITAL_COMMUNITY): Payer: Self-pay | Admitting: *Deleted

## 2021-02-17 DIAGNOSIS — K529 Noninfective gastroenteritis and colitis, unspecified: Secondary | ICD-10-CM | POA: Insufficient documentation

## 2021-02-17 DIAGNOSIS — Z87891 Personal history of nicotine dependence: Secondary | ICD-10-CM | POA: Insufficient documentation

## 2021-02-17 LAB — CBC
HCT: 45.3 % (ref 39.0–52.0)
Hemoglobin: 15.1 g/dL (ref 13.0–17.0)
MCH: 29.4 pg (ref 26.0–34.0)
MCHC: 33.3 g/dL (ref 30.0–36.0)
MCV: 88.3 fL (ref 80.0–100.0)
Platelets: 187 10*3/uL (ref 150–400)
RBC: 5.13 MIL/uL (ref 4.22–5.81)
RDW: 12.6 % (ref 11.5–15.5)
WBC: 8 10*3/uL (ref 4.0–10.5)
nRBC: 0 % (ref 0.0–0.2)

## 2021-02-17 LAB — COMPREHENSIVE METABOLIC PANEL
ALT: 20 U/L (ref 0–44)
AST: 33 U/L (ref 15–41)
Albumin: 4.2 g/dL (ref 3.5–5.0)
Alkaline Phosphatase: 52 U/L (ref 38–126)
Anion gap: 9 (ref 5–15)
BUN: 16 mg/dL (ref 6–20)
CO2: 26 mmol/L (ref 22–32)
Calcium: 8.7 mg/dL — ABNORMAL LOW (ref 8.9–10.3)
Chloride: 101 mmol/L (ref 98–111)
Creatinine, Ser: 1.14 mg/dL (ref 0.61–1.24)
GFR, Estimated: >60 mL/min (ref >60)
Glucose, Bld: 87 mg/dL (ref 70–99)
Potassium: 3.7 mmol/L (ref 3.5–5.1)
Sodium: 136 mmol/L (ref 135–145)
Total Bilirubin: 0.8 mg/dL (ref 0.3–1.2)
Total Protein: 7.5 g/dL (ref 6.5–8.1)

## 2021-02-17 LAB — LIPASE, BLOOD: Lipase: 43 U/L (ref 11–51)

## 2021-02-17 NOTE — ED Triage Notes (Signed)
Pt with abd pain, +N/V/D for past 2 days.  Pt believes it may be food poisoning.  Pt works around with uncooked chicken and pt thins some contaminated water splashed into pt's mouth, next day became sick.

## 2021-02-18 ENCOUNTER — Emergency Department (HOSPITAL_COMMUNITY)
Admission: EM | Admit: 2021-02-18 | Discharge: 2021-02-18 | Disposition: A | Discharge status: Home / Self Care | Payer: Self-pay | Attending: Emergency Medicine | Admitting: Emergency Medicine

## 2021-02-18 DIAGNOSIS — K529 Noninfective gastroenteritis and colitis, unspecified: Secondary | ICD-10-CM

## 2021-02-18 MED ORDER — DICYCLOMINE HCL 10 MG PO CAPS
10.0000 mg | ORAL_CAPSULE | Freq: Once | ORAL | Status: AC
  Administered 2021-02-18: 10 mg via ORAL
  Filled 2021-02-18: qty 1

## 2021-02-18 MED ORDER — DICYCLOMINE HCL 20 MG PO TABS: 20.0000 mg | ORAL_TABLET | Freq: Two times a day (BID) | ORAL | 0 refills | Status: DC

## 2021-02-18 MED ORDER — ONDANSETRON 4 MG PO TBDP
4.0000 mg | ORAL_TABLET | Freq: Once | ORAL | Status: AC
  Administered 2021-02-18: 4 mg via ORAL
  Filled 2021-02-18: qty 1

## 2021-02-18 MED ORDER — ONDANSETRON 4 MG PO TBDP: 4.0000 mg | ORAL_TABLET | Freq: Three times a day (TID) | ORAL | 0 refills | Status: DC | PRN

## 2021-02-18 NOTE — ED Provider Notes (Signed)
[facility]-EMERGENCY DEPT [facility] Emergency Department Provider Note MRN:  [id number]  Arrival date & time: 02/18/21     Chief Complaint   Abdominal Pain   History of Present Illness   Juan Woodward is a 30 y.o. year-old male with no pertinent past medical history presenting to the ED with chief complaint of abdominal pain.  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   Patient's Health History    Past Medical History:  Diagnosis Date   Allergy    Chronic sinusitis    Frequent nosebleeds    Headache    Nasal polyp    Scoliosis     Past Surgical History:  Procedure Laterality Date   ETHMOIDECTOMY Bilateral 05/22/2019   Procedure: TOTAL ETHMOIDECTOMY WITH TISSUE REMOVAL;  Surgeon: Newman Pies, MD;  Location: [facility];  Service: ENT;  Laterality: Bilateral;   FRONTAL SINUS EXPLORATION Bilateral 05/22/2019   Procedure: ENDOSCOPIC FRONTAL RECESS EXPLORATION;  Surgeon: Newman Pies, MD;  Location: [facility];  Service: ENT;  Laterality: Bilateral;   MAXILLARY ANTROSTOMY Bilateral 05/22/2019   Procedure: ENDOSCOPIC MAXILLARY ANTROSTOMY WITH TISSUE REMOVAL;  Surgeon: Newman Pies, MD;  Location: [facility];  Service: ENT;  Laterality: Bilateral;   NASAL SEPTOPLASTY W/ TURBINOPLASTY Bilateral 05/22/2019   Procedure: NASAL SEPTOPLASTY WITH BILATERAL TURBINATE REDUCTION;  Surgeon: Newman Pies, MD;  Location: [facility];  Service: ENT;  Laterality: Bilateral;   SINUS ENDO WITH FUSION Bilateral 05/22/2019   Procedure: SINUS ENDOSCOPY WITH FUSION NAVIGATION;  Surgeon: Newman Pies, MD;  Location: [facility];  Service: ENT;  Laterality: Bilateral;   SPHENOIDECTOMY Bilateral 05/22/2019   Procedure: SPHENOIDECTOMY WITH TISSUE REMOVAL;  Surgeon: Newman Pies, MD;  Location: [facility];  Service: ENT;  Laterality: Bilateral;    Family History  Problem Relation Age of Onset   Hypertension Mother    Diabetes Maternal Grandmother    Heart disease Neg Hx     Social History   Socioeconomic History   Marital status: Single    Spouse name: Not on file   Number of children: Not on file   Years of education: Not on file   Highest education level: Not on file  Occupational History   Not on file  Tobacco Use   Smoking status: Former    Packs/day: 0.20    Types: Cigarettes    Quit date: 11/07/2015    Years since quitting: 5.2   Smokeless tobacco: Never  Vaping Use   Vaping Use: Never used  Substance and Sexual Activity   Alcohol use: Yes    Alcohol/week: 6.0 standard drinks    Types: 6 Cans of beer per week    Comment: 10-12 cans of beer on the weekend every 2-3 weeks   Drug use: No   Sexual activity: Yes    Birth control/protection: Condom  Other Topics Concern   Not on file  Social History Narrative   Not on file   Social Determinants of Health  Financial Resource Strain: Not on file  Food Insecurity: Not on file  Transportation Needs: Not on file  Physical Activity: Not on file  Stress: Not on file  Social Connections: Not on file  Intimate Partner Violence: Not on file     Physical Exam   Vitals:   02/17/21 2308 02/17/21 2309  BP:  124/73  Pulse:  77  Resp:  14  Temp: 98 F (36.7 C)   SpO2:  99%    CONSTITUTIONAL: Well-appearing, NAD NEURO:  Alert and oriented x 3, no focal deficits EYES:  eyes equal and reactive ENT/NECK:  no LAD, no  JVD CARDIO: Regular rate, well-perfused, normal S1 and S2 PULM:  CTAB no wheezing or rhonchi GI/GU:  normal bowel sounds, non-distended, non-tender MSK/SPINE:  No gross deformities, no edema SKIN:  no rash, atraumatic PSYCH:  Appropriate speech and behavior  *Additional and/or pertinent findings included in MDM below  Diagnostic and Interventional Summary    EKG Interpretation  Date/Time:    Ventricular Rate:    PR Interval:    QRS Duration:   QT Interval:    QTC Calculation:   R Axis:     Text Interpretation:         Labs Reviewed  COMPREHENSIVE METABOLIC PANEL - Abnormal; Notable for the following components:      Result Value   Calcium 8.7 (*)    All other components within normal limits  LIPASE, BLOOD  CBC  URINALYSIS, ROUTINE W REFLEX MICROSCOPIC    No orders to display    Medications  ondansetron (ZOFRAN-ODT) disintegrating tablet 4 mg (has no administration in time range)  dicyclomine (BENTYL) capsule 10 mg (has no administration in time range)     Procedures  /  Critical Care Procedures  ED Course and Medical Decision Making  I have reviewed the triage vital signs, the nursing notes, and pertinent available records from the EMR.  Listed above are laboratory and imaging tests that I personally ordered, reviewed, and interpreted and then considered in my medical decision making (see below for details).  The history is actually suspicious for Salmonella infection causing gastroenteritis.  Normal vital signs, benign abdomen.  His major complaints are the persistent vomiting and diarrhea.  Having trouble eating and drinking at home.  Labs are reassuring.  Given his age and lack of comorbidities and mildness of symptoms here clinically, will hold off on antibiotics and provide symptomatic management at home.       Elmer Sow. Pilar Plate, MD [facility] Emergency Medicine [facility] [email or web address]  Final Clinical Impressions(s) / ED  Diagnoses     ICD-10-CM   1. Gastroenteritis  K52.9       ED Discharge Orders          Ordered    dicyclomine (BENTYL) 20 MG tablet  2 times daily        02/18/21 0139    ondansetron (ZOFRAN ODT) 4 MG disintegrating tablet  Every 8 hours PRN        02/18/21 0139             Discharge Instructions Discussed with and Provided to Patient:    Discharge Instructions      You were evaluated in the Emergency Department and after careful evaluation, we did not find any emergent condition requiring admission or further testing in the hospital.  Your exam/testing today is overall reassuring.  Symptoms may be due to Salmonella.  Use the Zofran as needed for nausea.  Use the Bentyl as needed for abdominal pain.  Can use over-the-counter Imodium if you are having a lot of discomfort or issue with copious diarrhea.  Use the medicines to help you drink plenty of water and Gatorade at home.  Please return to the Emergency Department if you experience any worsening of your condition.   Thank you for allowing [place] to be a part of your care.       Sabas Sous, MD 02/18/21 [phone]

## 2021-02-18 NOTE — Discharge Instructions (Addendum)
You were evaluated in the Emergency Department and after careful evaluation, we did not find any emergent condition requiring admission or further testing in the hospital.  Your exam/testing today is overall reassuring.  Symptoms may be due to Salmonella.  Use the Zofran as needed for nausea.  Use the Bentyl as needed for abdominal pain.  Can use over-the-counter Imodium if you are having a lot of discomfort or issue with copious diarrhea.  Use the medicines to help you drink plenty of water and Gatorade at home.  Please return to the Emergency Department if you experience any worsening of your condition.   Thank you for allowing [place] to be a part of your care.

## 2021-03-04 ENCOUNTER — Other Ambulatory Visit: Payer: Self-pay

## 2021-03-04 ENCOUNTER — Encounter: Payer: Self-pay | Admitting: Emergency Medicine

## 2021-03-04 ENCOUNTER — Emergency Department (HOSPITAL_COMMUNITY)
Admission: EM | Admit: 2021-03-04 | Discharge: 2021-03-04 | Disposition: A | Discharge status: Home / Self Care | Payer: Self-pay

## 2021-03-04 ENCOUNTER — Ambulatory Visit
Admission: EM | Admit: 2021-03-04 | Discharge: 2021-03-04 | Disposition: A | Discharge status: Home / Self Care | Payer: Self-pay | Attending: Physician Assistant | Admitting: Physician Assistant

## 2021-03-04 DIAGNOSIS — S29012A Strain of muscle and tendon of back wall of thorax, initial encounter: Secondary | ICD-10-CM

## 2021-03-04 MED ORDER — METHOCARBAMOL 500 MG PO TABS: 500.0000 mg | ORAL_TABLET | Freq: Four times a day (QID) | ORAL | 0 refills | Status: DC

## 2021-03-04 NOTE — ED Provider Notes (Signed)
[facility]    CSN: [id number] Arrival date & time: 03/04/21  1641      History   Chief Complaint No chief complaint on file.   HPI Juan Woodward is a 30 y.o. male.   The history is provided by the patient. No language interpreter was used.  Back Pain Location:  Lumbar spine Quality:  Aching Radiates to:  Does not radiate Pain severity:  Moderate Pain is:  Worse during the day Onset quality:  Gradual Duration:  1 day Timing:  Constant Progression:  Worsening Chronicity:  New Relieved by:  Nothing Worsened by:  Nothing Ineffective treatments:  None tried Associated symptoms: no abdominal pain    Past Medical History:  Diagnosis Date   Allergy    Chronic sinusitis    Frequent nosebleeds    Headache    Nasal polyp    Scoliosis     Patient Active Problem List   Diagnosis Date Noted   Nasal polyp 07/20/2016   Cerumen impaction 07/20/2016   Healthcare maintenance 07/20/2016   Elevated blood pressure reading 07/20/2016    Past Surgical History:  Procedure Laterality Date   ETHMOIDECTOMY Bilateral 05/22/2019   Procedure: TOTAL ETHMOIDECTOMY WITH TISSUE REMOVAL;  Surgeon: Newman Pies, MD;  Location: [facility];  Service: ENT;  Laterality: Bilateral;   FRONTAL SINUS EXPLORATION Bilateral 05/22/2019   Procedure: ENDOSCOPIC FRONTAL RECESS EXPLORATION;  Surgeon: Newman Pies, MD;  Location: [facility];  Service: ENT;  Laterality: Bilateral;   MAXILLARY ANTROSTOMY Bilateral 05/22/2019   Procedure: ENDOSCOPIC MAXILLARY ANTROSTOMY WITH TISSUE REMOVAL;  Surgeon: Newman Pies, MD;  Location: [facility];  Service: ENT;  Laterality: Bilateral;   NASAL SEPTOPLASTY W/ TURBINOPLASTY Bilateral 05/22/2019   Procedure: NASAL SEPTOPLASTY WITH BILATERAL TURBINATE REDUCTION;  Surgeon: Newman Pies, MD;  Location: [facility];  Service: ENT;  Laterality: Bilateral;   SINUS ENDO WITH FUSION Bilateral 05/22/2019    Procedure: SINUS ENDOSCOPY WITH FUSION NAVIGATION;  Surgeon: Newman Pies, MD;  Location: [facility];  Service: ENT;  Laterality: Bilateral;   SPHENOIDECTOMY Bilateral 05/22/2019   Procedure: SPHENOIDECTOMY WITH TISSUE REMOVAL;  Surgeon: Newman Pies, MD;  Location: [facility];  Service: ENT;  Laterality: Bilateral;       Home Medications    Prior to Admission medications   Medication Sig Start Date End Date Taking? Authorizing Provider  dicyclomine (BENTYL) 20 MG tablet Take 1 tablet (20 mg total) by mouth 2 (two) times daily. 02/18/21   Sabas Sous, MD  ondansetron (ZOFRAN ODT) 4 MG disintegrating tablet Take 1 tablet (4 mg total) by mouth every 8 (eight) hours as needed for nausea or vomiting. 02/18/21   Sabas Sous, MD    Family History Family History  Problem Relation Age of Onset   Hypertension Mother    Diabetes Maternal Grandmother    Heart disease Neg Hx     Social History Social History   Tobacco Use   Smoking status: Former    Packs/day: 0.20    Types: Cigarettes    Quit date: 11/07/2015    Years since quitting: 5.3   Smokeless tobacco: Never  Vaping Use   Vaping Use: Never used  Substance Use Topics   Alcohol use: Yes    Alcohol/week: 6.0 standard drinks    Types: 6 Cans of beer per week    Comment: 10-12 cans of beer on the weekend every 2-3 weeks   Drug use: No  Allergies   Ibuprofen   Review of Systems Review of Systems  Gastrointestinal:  Negative for abdominal pain.  Musculoskeletal:  Positive for back pain.  All other systems reviewed and are negative.   Physical Exam Triage Vital Signs ED Triage Vitals  Enc Vitals Group     BP 03/04/21 1855 132/80     Pulse Rate 03/04/21 1855 63     Resp 03/04/21 1855 18     Temp 03/04/21 1855 (!) 97.1 F (36.2 C)     Temp Source 03/04/21 1855 Oral     SpO2 03/04/21 1855 98 %     Weight --      Height --      Head Circumference --      Peak Flow --      Pain Score  03/04/21 1854 6     Pain Loc --      Pain Edu? --      Excl. in GC? --    No data found.  Updated Vital Signs BP 132/80 (BP Location: Right Arm)   Pulse 63   Temp (!) 97.1 F (36.2 C) (Oral)   Resp 18   SpO2 98%   Visual Acuity Right Eye Distance:   Left Eye Distance:   Bilateral Distance:    Right Eye Near:   Left Eye Near:    Bilateral Near:     Physical Exam Vitals and nursing note reviewed.  Constitutional:      Appearance: He is well-developed.  HENT:     Head: Normocephalic.  Cardiovascular:     Rate and Rhythm: Normal rate.  Pulmonary:     Effort: Pulmonary effort is normal.  Abdominal:     General: There is no distension.  Musculoskeletal:        General: Normal range of motion.     Cervical back: Normal range of motion.     Comments: Tender right lower lumbar spine nv and ns intact  Neurological:     Mental Status: He is alert and oriented to person, place, and time.     UC Treatments / Results  Labs (all labs ordered are listed, but only abnormal results are displayed) Labs Reviewed - No data to display  EKG   Radiology No results found.  Procedures Procedures (including critical care time)  Medications Ordered in UC Medications - No data to display  Initial Impression / Assessment and Plan / UC Course  I have reviewed the triage vital signs and the nursing notes.  Pertinent labs & imaging results that were available during my care of the patient were reviewed by me and considered in my medical decision making (see chart for details).     MDM:  tender ls spine, pain with movement  Final Clinical Impressions(s) / UC Diagnoses   Final diagnoses:  Muscle strain of left upper back, initial encounter   Discharge Instructions   None    ED Prescriptions     Medication Sig Dispense Auth. Provider   methocarbamol (ROBAXIN) 500 MG tablet Take 1 tablet (500 mg total) by mouth 4 (four) times daily. 20 tablet Elson Areas, [place]       PDMP not reviewed this encounter. An After Visit Summary was printed and given to the patient.    Elson Areas, [place] 03/05/21 1137

## 2021-03-04 NOTE — ED Triage Notes (Signed)
Lower back pain after picking up a large water can at work last night.

## 2022-05-25 ENCOUNTER — Encounter (HOSPITAL_COMMUNITY): Payer: Self-pay | Admitting: Emergency Medicine

## 2022-05-25 ENCOUNTER — Emergency Department (HOSPITAL_COMMUNITY)
Admission: EM | Admit: 2022-05-25 | Discharge: 2022-05-26 | Disposition: A | Discharge status: Home / Self Care | Payer: Self-pay | Attending: Emergency Medicine | Admitting: Emergency Medicine

## 2022-05-25 ENCOUNTER — Other Ambulatory Visit: Payer: Self-pay

## 2022-05-25 DIAGNOSIS — J069 Acute upper respiratory infection, unspecified: Secondary | ICD-10-CM | POA: Insufficient documentation

## 2022-05-25 DIAGNOSIS — Z1152 Encounter for screening for COVID-19: Secondary | ICD-10-CM | POA: Insufficient documentation

## 2022-05-25 LAB — RESP PANEL BY RT-PCR (RSV, FLU A&B, COVID)  RVPGX2
Influenza A by PCR: NEGATIVE
Influenza B by PCR: NEGATIVE
Resp Syncytial Virus by PCR: NEGATIVE
SARS Coronavirus 2 by RT PCR: NEGATIVE

## 2022-05-25 NOTE — ED Triage Notes (Signed)
Pt c/o body aches, fever, headache and cold sweats x 2 days.

## 2022-05-26 NOTE — Discharge Instructions (Signed)
Drink plenty of fluids and get plenty of rest.  Take over-the-counter medications as needed for symptom relief.  Follow-up with primary doctor if not improving in the next week, and return to the ER if symptoms significantly worsen or change.

## 2022-05-26 NOTE — ED Provider Notes (Signed)
[facility] EMERGENCY DEPARTMENT Provider Note   CSN: [id number] Arrival date & time: 05/25/22  2232     History  Chief Complaint  Patient presents with   Generalized Body Aches    Juan Woodward is a 31 y.o. male.  Patient is a 31 year old male with no significant past medical history.  Patient presenting today with complaints of URI-like symptoms.  He reports nasal congestion, cough, body aches, and fever worsening over the past 2 days.  He tells me his wife tested positive for influenza.  He reports 1 episode of vomiting and perhaps some loose stool, but no abdominal pain.  He has been taking Tylenol which has not helped.  The history is provided by the patient.       Home Medications Prior to Admission medications   Medication Sig Start Date End Date Taking? Authorizing Provider  dicyclomine (BENTYL) 20 MG tablet Take 1 tablet (20 mg total) by mouth 2 (two) times daily. 02/18/21   Sabas Sous, MD  methocarbamol (ROBAXIN) 500 MG tablet Take 1 tablet (500 mg total) by mouth 4 (four) times daily. 03/04/21   Elson Areas, PA-C  ondansetron (ZOFRAN ODT) 4 MG disintegrating tablet Take 1 tablet (4 mg total) by mouth every 8 (eight) hours as needed for nausea or vomiting. 02/18/21   Sabas Sous, MD      Allergies    Ibuprofen    Review of Systems   Review of Systems  All other systems reviewed and are negative.   Physical Exam Updated Vital Signs BP 95/72 (BP Location: Right Arm)   Pulse (!) 117   Temp 99.3 F (37.4 C) (Oral)   Ht 6\' 4"  (1.93 m)   Wt 88.5 kg   SpO2 94%   BMI 23.74 kg/m  Physical Exam Vitals and nursing note reviewed.  Constitutional:      General: He is not in acute distress.    Appearance: He is well-developed. He is not diaphoretic.  HENT:     Head: Normocephalic and atraumatic.     Mouth/Throat:     Mouth: Mucous membranes are moist.     Pharynx: No oropharyngeal exudate or posterior oropharyngeal erythema.  Cardiovascular:      Rate and Rhythm: Normal rate and regular rhythm.     Heart sounds: No murmur heard.    No friction rub.  Pulmonary:     Effort: Pulmonary effort is normal. No respiratory distress.     Breath sounds: Normal breath sounds. No wheezing or rales.  Abdominal:     General: Bowel sounds are normal. There is no distension.     Palpations: Abdomen is soft.     Tenderness: There is no abdominal tenderness.  Musculoskeletal:        General: Normal range of motion.     Cervical back: Normal range of motion and neck supple.  Skin:    General: Skin is warm and dry.  Neurological:     Mental Status: He is alert and oriented to person, place, and time.     Coordination: Coordination normal.     ED Results / Procedures / Treatments   Labs (all labs ordered are listed, but only abnormal results are displayed) Labs Reviewed  RESP PANEL BY RT-PCR (RSV, FLU A&B, COVID)  [id number]    EKG None  Radiology No results found.  Procedures Procedures    Medications Ordered in ED Medications - No data to display  ED Course/ Medical Decision Making/ A&P  Patient presenting with URI symptoms as described in the HPI.  Symptoms are most likely viral in nature and have only been present for the past 2 days.  Influenza, COVID, and RSV tests are negative.  I have advised patient to drink plenty of fluids, take over-the-counter medications as needed, and have provided a work excuse for the next 2 days.  To return as needed/follow-up for any problems.  Final Clinical Impression(s) / ED Diagnoses Final diagnoses:  None    Rx / DC Orders ED Discharge Orders     None         Geoffery Lyons, MD 05/26/22 [phone]

## 2022-06-27 ENCOUNTER — Other Ambulatory Visit: Payer: Self-pay

## 2022-06-27 ENCOUNTER — Emergency Department (HOSPITAL_COMMUNITY): Payer: Self-pay

## 2022-06-27 ENCOUNTER — Emergency Department (HOSPITAL_COMMUNITY)
Admission: EM | Admit: 2022-06-27 | Discharge: 2022-06-27 | Disposition: A | Discharge status: Home / Self Care | Payer: Self-pay | Attending: Emergency Medicine | Admitting: Emergency Medicine

## 2022-06-27 DIAGNOSIS — Y92410 Unspecified street and highway as the place of occurrence of the external cause: Secondary | ICD-10-CM | POA: Diagnosis not present

## 2022-06-27 DIAGNOSIS — M533 Sacrococcygeal disorders, not elsewhere classified: Secondary | ICD-10-CM | POA: Insufficient documentation

## 2022-06-27 DIAGNOSIS — M542 Cervicalgia: Secondary | ICD-10-CM | POA: Insufficient documentation

## 2022-06-27 MED ORDER — ACETAMINOPHEN 325 MG PO TABS
650.0000 mg | ORAL_TABLET | Freq: Once | ORAL | Status: AC
  Administered 2022-06-27: 650 mg via ORAL
  Filled 2022-06-27: qty 2

## 2022-06-27 MED ORDER — CYCLOBENZAPRINE HCL 10 MG PO TABS
10.0000 mg | ORAL_TABLET | Freq: Once | ORAL | Status: AC
  Administered 2022-06-27: 10 mg via ORAL
  Filled 2022-06-27: qty 1

## 2022-06-27 MED ORDER — ACETAMINOPHEN 325 MG PO TABS: 650.0000 mg | ORAL_TABLET | Freq: Four times a day (QID) | ORAL | 0 refills | Status: DC | PRN

## 2022-06-27 MED ORDER — CYCLOBENZAPRINE HCL 10 MG PO TABS: 10.0000 mg | ORAL_TABLET | Freq: Two times a day (BID) | ORAL | 0 refills | Status: DC | PRN

## 2022-06-27 MED ORDER — ACETAMINOPHEN 500 MG PO TABS
1000.0000 mg | ORAL_TABLET | ORAL | Status: AC
  Administered 2022-06-27: 1000 mg via ORAL
  Filled 2022-06-27: qty 2

## 2022-06-27 NOTE — Discharge Instructions (Addendum)
Please return to the emergency department if you have new or worsening pain in your abdomen, persistent vomiting, lightheadedness, loss of consciousness, numbness or weakness in your legs or arms, or difficulty with urination or moving your bowels.
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
## 2023-06-19 ENCOUNTER — Emergency Department (HOSPITAL_COMMUNITY): Payer: Self-pay

## 2023-06-19 ENCOUNTER — Emergency Department (HOSPITAL_COMMUNITY)
Admission: EM | Admit: 2023-06-19 | Discharge: 2023-06-19 | Disposition: A | Discharge status: Home / Self Care | Payer: Self-pay | Attending: Emergency Medicine | Admitting: Emergency Medicine

## 2023-06-19 ENCOUNTER — Other Ambulatory Visit: Payer: Self-pay

## 2023-06-19 DIAGNOSIS — S86912A Strain of unspecified muscle(s) and tendon(s) at lower leg level, left leg, initial encounter: Secondary | ICD-10-CM | POA: Insufficient documentation

## 2023-06-19 DIAGNOSIS — W000XXA Fall on same level due to ice and snow, initial encounter: Secondary | ICD-10-CM | POA: Insufficient documentation

## 2023-06-19 NOTE — Discharge Instructions (Addendum)
 Your x-rays look fine, there is no broken bones, you may very well have a ligament strain on the inside of your knee so you can follow-up with the orthopedist.  Dr. Onesimo is the orthopedist on-call, his office is across from the hospital.  You can call first thing on Monday morning to make an appointment for the coming week if this is continuing to bother you.  You can take Tylenol  every 6 hours as needed for pain until that time.  If you develop severe worsening pain swelling numbness or weakness return to the ER  Xrays are normal  Apply ice packs intermittently to help with pain and swelling
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
## 2023-06-19 NOTE — ED Triage Notes (Signed)
 Pt states he slipped on ice yesterday, falling on his left leg. C/o soreness to left leg, ambulatory in triage.

## 2023-08-12 ENCOUNTER — Ambulatory Visit
Admission: EM | Admit: 2023-08-12 | Discharge: 2023-08-12 | Disposition: A | Discharge status: Home / Self Care | Payer: Self-pay | Attending: Nurse Practitioner | Admitting: Nurse Practitioner

## 2023-08-12 DIAGNOSIS — R059 Cough, unspecified: Secondary | ICD-10-CM

## 2023-08-12 DIAGNOSIS — R062 Wheezing: Secondary | ICD-10-CM

## 2023-08-12 DIAGNOSIS — J22 Unspecified acute lower respiratory infection: Secondary | ICD-10-CM

## 2023-08-12 LAB — POC COVID19/FLU A&B COMBO
Covid Antigen, POC: NEGATIVE
Influenza A Antigen, POC: NEGATIVE
Influenza B Antigen, POC: NEGATIVE

## 2023-08-12 MED ORDER — PREDNISONE 20 MG PO TABS: 40.0000 mg | ORAL_TABLET | Freq: Every day | ORAL | 0 refills | Status: AC

## 2023-08-12 MED ORDER — IPRATROPIUM-ALBUTEROL 0.5-2.5 (3) MG/3ML IN SOLN
3.0000 mL | Freq: Once | RESPIRATORY_TRACT | Status: AC
  Administered 2023-08-12: 3 mL via RESPIRATORY_TRACT

## 2023-08-12 MED ORDER — METHYLPREDNISOLONE SODIUM SUCC 125 MG IJ SOLR
125.0000 mg | Freq: Once | INTRAMUSCULAR | Status: AC
  Administered 2023-08-12: 125 mg via INTRAMUSCULAR

## 2023-08-12 MED ORDER — ALBUTEROL SULFATE HFA 108 (90 BASE) MCG/ACT IN AERS: 2.0000 | INHALATION_SPRAY | Freq: Four times a day (QID) | RESPIRATORY_TRACT | 0 refills | Status: AC | PRN

## 2023-08-12 MED ORDER — AMOXICILLIN-POT CLAVULANATE 875-125 MG PO TABS: 1.0000 | ORAL_TABLET | Freq: Two times a day (BID) | ORAL | 0 refills | Status: AC

## 2023-08-12 NOTE — ED Provider Notes (Signed)
 [facility]    CSN: [id number] Arrival date & time: 08/12/23  0807      History   Chief Complaint No chief complaint on file.   HPI Juan Woodward is a 33 y.o. male.   The history is provided by the patient.   Patient presents for complaints of cough, body aches, chest congestion, and shortness of breath that started over the past 24 hours.  Denies fever, chills, headache, ear pain, chest pain, abdominal pain, nausea, vomiting, diarrhea, or rash.  Patient states that he has had shortness of breath and chest tightness since his symptoms started.  Denies prior history of asthma, smoking, or other lung disease.  Patient states he has been taking NyQuil for his symptoms.  Past Medical History:  Diagnosis Date   Allergy    Chronic sinusitis    Frequent nosebleeds    Headache    Nasal polyp    Scoliosis     Patient Active Problem List   Diagnosis Date Noted   Nasal polyp 07/20/2016   Cerumen impaction 07/20/2016   Healthcare maintenance 07/20/2016   Elevated blood pressure reading 07/20/2016    Past Surgical History:  Procedure Laterality Date   ETHMOIDECTOMY Bilateral 05/22/2019   Procedure: TOTAL ETHMOIDECTOMY WITH TISSUE REMOVAL;  Surgeon: Newman Pies, MD;  Location: [facility];  Service: ENT;  Laterality: Bilateral;   FRONTAL SINUS EXPLORATION Bilateral 05/22/2019   Procedure: ENDOSCOPIC FRONTAL RECESS EXPLORATION;  Surgeon: Newman Pies, MD;  Location: [facility];  Service: ENT;  Laterality: Bilateral;   MAXILLARY ANTROSTOMY Bilateral 05/22/2019   Procedure: ENDOSCOPIC MAXILLARY ANTROSTOMY WITH TISSUE REMOVAL;  Surgeon: Newman Pies, MD;  Location: [facility];  Service: ENT;  Laterality: Bilateral;   NASAL SEPTOPLASTY W/ TURBINOPLASTY Bilateral 05/22/2019   Procedure: NASAL SEPTOPLASTY WITH BILATERAL TURBINATE REDUCTION;  Surgeon: Newman Pies, MD;  Location: [facility];  Service: ENT;  Laterality:  Bilateral;   SINUS ENDO WITH FUSION Bilateral 05/22/2019   Procedure: SINUS ENDOSCOPY WITH FUSION NAVIGATION;  Surgeon: Newman Pies, MD;  Location: [facility];  Service: ENT;  Laterality: Bilateral;   SPHENOIDECTOMY Bilateral 05/22/2019   Procedure: SPHENOIDECTOMY WITH TISSUE REMOVAL;  Surgeon: Newman Pies, MD;  Location: [facility];  Service: ENT;  Laterality: Bilateral;       Home Medications    Prior to Admission medications   Medication Sig Start Date End Date Taking? Authorizing Provider  albuterol (VENTOLIN HFA) 108 (90 Base) MCG/ACT inhaler Inhale 2 puffs into the lungs every 6 (six) hours as needed. 08/12/23  Yes Leath-Warren, Sadie Haber, NP  amoxicillin-clavulanate (AUGMENTIN) 875-125 MG tablet Take 1 tablet by mouth every 12 (twelve) hours. 08/12/23  Yes Leath-Warren, Sadie Haber, NP  predniSONE (DELTASONE) 20 MG tablet Take 2 tablets (40 mg total) by mouth daily with breakfast for 5 days. 08/12/23 08/17/23 Yes Leath-Warren, Sadie Haber, NP    Family History Family History  Problem Relation Age of Onset   Hypertension Mother    Diabetes Maternal Grandmother    Heart disease Neg Hx     Social History Social History   Tobacco Use   Smoking status: Former    Current packs/day: 0.00    Types: Cigarettes    Quit date: 11/07/2015    Years since quitting: 7.7   Smokeless tobacco: Never  Vaping Use   Vaping status: Never Used  Substance Use Topics   Alcohol use: Yes    Alcohol/week: 6.0 standard drinks  of alcohol    Types: 6 Cans of beer per week    Comment: 10-12 cans of beer on the weekend every 2-3 weeks   Drug use: No     Allergies   Ibuprofen   Review of Systems Review of Systems Per HPI  Physical Exam Triage Vital Signs ED Triage Vitals  Encounter Vitals Group     BP 08/12/23 0930 128/80     Systolic BP Percentile --      Diastolic BP Percentile --      Pulse Rate 08/12/23 0930 86     Resp 08/12/23 0930 (!) 24     Temp 08/12/23  0930 98.9 F (37.2 C)     Temp Source 08/12/23 0930 Oral     SpO2 08/12/23 0930 91 %     Weight --      Height --      Head Circumference --      Peak Flow --      Pain Score 08/12/23 0934 4     Pain Loc --      Pain Education --      Exclude from Growth Chart --    No data found.  Updated Vital Signs BP 128/80 (BP Location: Right Arm)   Pulse 86   Temp 98.9 F (37.2 C) (Oral)   Resp (!) 24   SpO2 91%   Visual Acuity Right Eye Distance:   Left Eye Distance:   Bilateral Distance:    Right Eye Near:   Left Eye Near:    Bilateral Near:     Physical Exam Vitals and nursing note reviewed.  Constitutional:      General: He is not in acute distress.    Appearance: Normal appearance.  HENT:     Head: Normocephalic.     Right Ear: Tympanic membrane, ear canal and external ear normal.     Left Ear: Tympanic membrane, ear canal and external ear normal.     Nose: Congestion present.     Mouth/Throat:     Mouth: Mucous membranes are moist.     Pharynx: Oropharynx is clear. Uvula midline. No pharyngeal swelling, posterior oropharyngeal erythema or postnasal drip.  Eyes:     Extraocular Movements: Extraocular movements intact.     Conjunctiva/sclera: Conjunctivae normal.     Pupils: Pupils are equal, round, and reactive to light.  Cardiovascular:     Rate and Rhythm: Normal rate and regular rhythm.     Pulses: Normal pulses.     Heart sounds: Normal heart sounds.  Pulmonary:     Effort: Pulmonary effort is normal.     Breath sounds: Examination of the right-lower field reveals decreased breath sounds and wheezing. Examination of the left-lower field reveals decreased breath sounds and wheezing. Decreased breath sounds and wheezing present.  Abdominal:     General: Bowel sounds are normal.     Palpations: Abdomen is soft.     Tenderness: There is no abdominal tenderness.  Musculoskeletal:     Cervical back: Normal range of motion.  Skin:    General: Skin is warm and  dry.  Neurological:     General: No focal deficit present.     Mental Status: He is alert and oriented to person, place, and time.  Psychiatric:        Mood and Affect: Mood normal.        Behavior: Behavior normal.      UC Treatments / Results  Labs (all labs ordered  are listed, but only abnormal results are displayed) Labs Reviewed  POC COVID19/FLU A&B COMBO - Normal    EKG   Radiology No results found.  Procedures Procedures (including critical care time)  Medications Ordered in UC Medications  ipratropium-albuterol (DUONEB) 0.5-2.5 (3) MG/3ML nebulizer solution 3 mL (3 mLs Nebulization Given 08/12/23 0952)  methylPREDNISolone sodium succinate (SOLU-MEDROL) 125 mg/2 mL injection 125 mg (125 mg Intramuscular Given 08/12/23 0952)    Initial Impression / Assessment and Plan / UC Course  I have reviewed the triage vital signs and the nursing notes.  Pertinent labs & imaging results that were available during my care of the patient were reviewed by me and considered in my medical decision making (see chart for details).  COVID/flu test was negative.  Upon presentation, patient is room air sats were at 91%.  He was also tachypneic and wheezing.  DuoNeb and Solu-Medrol 125 mg IM were administered.  Lung sounds with good improvement post DuoNeb, patient also reports improvement of his breathing.  Room air sats ranging between 96 to 98% post DuoNeb.  Unable to perform imaging due to staffing, patient was made aware.  Will treat patient for wheezing, lower respiratory infection, and cough.  Will treat with Augmentin 875/125 mg twice daily for the next 7 days, prednisone 40 mg for the next 5 days, and albuterol inhaler as needed for shortness of breath or wheezing.  Supportive care recommendations were provided and discussed with the patient to include fluids, rest, and over-the-counter analgesics.  Discussed indications for follow-up.  Patient was in agreement with this plan of care and  verbalizes understanding.  All questions were answered.  Patient stable for discharge.  Work note was provided.  Final Clinical Impressions(s) / UC Diagnoses   Final diagnoses:  Wheezing  Lower respiratory infection  Cough, unspecified type     Discharge Instructions      The COVID/flu test was negative. You were given an injection of Solu-Medrol 125 mg.  Begin the prednisone on 08/13/2023. Take medication as prescribed. Increase fluids and allow for plenty of rest. May take over-the-counter Tylenol or ibuprofen as needed for pain, fever, or general discomfort. May use normal saline nasal spray throughout the day for nasal congestion and runny nose. For your cough, recommend using a humidifier in your bedroom at nighttime during sleep and sleeping elevated on pillows while symptoms persist. Go to the emergency department immediately if you experience worsening shortness of breath, wheezing, difficulty breathing, or other concerns. Follow-up as needed.     ED Prescriptions     Medication Sig Dispense Auth. Provider   amoxicillin-clavulanate (AUGMENTIN) 875-125 MG tablet Take 1 tablet by mouth every 12 (twelve) hours. 14 tablet Leath-Warren, Sadie Haber, NP   predniSONE (DELTASONE) 20 MG tablet Take 2 tablets (40 mg total) by mouth daily with breakfast for 5 days. 10 tablet Leath-Warren, Sadie Haber, NP   albuterol (VENTOLIN HFA) 108 (90 Base) MCG/ACT inhaler Inhale 2 puffs into the lungs every 6 (six) hours as needed. 8 g Leath-Warren, Sadie Haber, NP      PDMP not reviewed this encounter.   Abran Cantor, NP 08/12/23 1013

## 2023-08-12 NOTE — Discharge Instructions (Addendum)
 The COVID/flu test was negative. You were given an injection of Solu-Medrol 125 mg.  Begin the prednisone on 08/13/2023. Take medication as prescribed. Increase fluids and allow for plenty of rest. May take over-the-counter Tylenol or ibuprofen as needed for pain, fever, or general discomfort. May use normal saline nasal spray throughout the day for nasal congestion and runny nose. For your cough, recommend using a humidifier in your bedroom at nighttime during sleep and sleeping elevated on pillows while symptoms persist. Go to the emergency department immediately if you experience worsening shortness of breath, wheezing, difficulty breathing, or other concerns. Follow-up as needed.

## 2023-08-12 NOTE — ED Triage Notes (Signed)
 Pt reports cough, congestion, SOB, difficulty breathing x 1 day.

## 2023-08-12 NOTE — ED Notes (Signed)
 Ear wax removal entered in error. Task not performed.

## 2023-08-22 ENCOUNTER — Emergency Department (HOSPITAL_COMMUNITY): Payer: Self-pay

## 2023-08-22 ENCOUNTER — Emergency Department (HOSPITAL_COMMUNITY)
Admission: EM | Admit: 2023-08-22 | Discharge: 2023-08-22 | Disposition: A | Discharge status: Home / Self Care | Payer: Self-pay | Attending: Emergency Medicine | Admitting: Emergency Medicine

## 2023-08-22 DIAGNOSIS — Z79899 Other long term (current) drug therapy: Secondary | ICD-10-CM | POA: Insufficient documentation

## 2023-08-22 DIAGNOSIS — R1084 Generalized abdominal pain: Secondary | ICD-10-CM | POA: Insufficient documentation

## 2023-08-22 DIAGNOSIS — D72829 Elevated white blood cell count, unspecified: Secondary | ICD-10-CM | POA: Insufficient documentation

## 2023-08-22 LAB — LIPASE, BLOOD: Lipase: 36 U/L (ref 11–51)

## 2023-08-22 LAB — COMPREHENSIVE METABOLIC PANEL
ALT: 16 U/L (ref 0–44)
AST: 20 U/L (ref 15–41)
Albumin: 4.1 g/dL (ref 3.5–5.0)
Alkaline Phosphatase: 70 U/L (ref 38–126)
Anion gap: 6 (ref 5–15)
BUN: 12 mg/dL (ref 6–20)
CO2: 26 mmol/L (ref 22–32)
Calcium: 8.9 mg/dL (ref 8.9–10.3)
Chloride: 104 mmol/L (ref 98–111)
Creatinine, Ser: 0.92 mg/dL (ref 0.61–1.24)
GFR, Estimated: >60 mL/min (ref >60)
Glucose, Bld: 100 mg/dL — ABNORMAL HIGH (ref 70–99)
Potassium: 4.1 mmol/L (ref 3.5–5.1)
Sodium: 136 mmol/L (ref 135–145)
Total Bilirubin: 0.7 mg/dL (ref 0.0–1.2)
Total Protein: 7.6 g/dL (ref 6.5–8.1)

## 2023-08-22 LAB — URINALYSIS, ROUTINE W REFLEX MICROSCOPIC
Bilirubin Urine: NEGATIVE
Glucose, UA: NEGATIVE mg/dL
Hgb urine dipstick: NEGATIVE
Ketones, ur: NEGATIVE mg/dL
Leukocytes,Ua: NEGATIVE
Nitrite: NEGATIVE
Protein, ur: NEGATIVE mg/dL
Specific Gravity, Urine: >1.046 — ABNORMAL HIGH (ref 1.005–1.030)
pH: 7 (ref 5.0–8.0)

## 2023-08-22 LAB — CBC
HCT: 46.1 % (ref 39.0–52.0)
Hemoglobin: 15.3 g/dL (ref 13.0–17.0)
MCH: 29.4 pg (ref 26.0–34.0)
MCHC: 33.2 g/dL (ref 30.0–36.0)
MCV: 88.7 fL (ref 80.0–100.0)
Platelets: 265 10*3/uL (ref 150–400)
RBC: 5.2 MIL/uL (ref 4.22–5.81)
RDW: 13 % (ref 11.5–15.5)
WBC: 12.4 10*3/uL — ABNORMAL HIGH (ref 4.0–10.5)
nRBC: 0 % (ref 0.0–0.2)

## 2023-08-22 MED ORDER — PREDNISONE 20 MG PO TABS
40.0000 mg | ORAL_TABLET | Freq: Once | ORAL | Status: AC
  Administered 2023-08-22: 40 mg via ORAL
  Filled 2023-08-22: qty 2

## 2023-08-22 MED ORDER — PREDNISONE 10 MG PO TABS: ORAL_TABLET | ORAL | 0 refills | Status: AC

## 2023-08-22 MED ORDER — PROCHLORPERAZINE EDISYLATE 10 MG/2ML IJ SOLN
10.0000 mg | Freq: Once | INTRAMUSCULAR | Status: AC
  Administered 2023-08-22: 10 mg via INTRAVENOUS
  Filled 2023-08-22: qty 2

## 2023-08-22 MED ORDER — DICYCLOMINE HCL 10 MG PO CAPS
10.0000 mg | ORAL_CAPSULE | Freq: Once | ORAL | Status: AC
  Administered 2023-08-22: 10 mg via ORAL
  Filled 2023-08-22: qty 1

## 2023-08-22 MED ORDER — POLYETHYLENE GLYCOL 3350 17 G PO PACK
17.0000 g | PACK | Freq: Every day | ORAL | Status: DC
  Administered 2023-08-22: 17 g via ORAL
  Filled 2023-08-22: qty 1

## 2023-08-22 MED ORDER — FENTANYL CITRATE PF 50 MCG/ML IJ SOSY
50.0000 ug | PREFILLED_SYRINGE | Freq: Once | INTRAMUSCULAR | Status: AC
  Administered 2023-08-22: 50 ug via INTRAVENOUS
  Filled 2023-08-22: qty 1

## 2023-08-22 MED ORDER — ONDANSETRON 4 MG PO TBDP: 4.0000 mg | ORAL_TABLET | Freq: Three times a day (TID) | ORAL | 0 refills | Status: AC | PRN

## 2023-08-22 MED ORDER — ONDANSETRON HCL 4 MG/2ML IJ SOLN
4.0000 mg | Freq: Once | INTRAMUSCULAR | Status: AC
  Administered 2023-08-22: 4 mg via INTRAVENOUS
  Filled 2023-08-22: qty 2

## 2023-08-22 MED ORDER — IOHEXOL 300 MG/ML  SOLN
100.0000 mL | Freq: Once | INTRAMUSCULAR | Status: AC | PRN
  Administered 2023-08-22: 100 mL via INTRAVENOUS

## 2023-08-22 MED ORDER — DICYCLOMINE HCL 20 MG PO TABS: 20.0000 mg | ORAL_TABLET | Freq: Two times a day (BID) | ORAL | 0 refills | Status: AC | PRN

## 2023-08-22 NOTE — Discharge Instructions (Addendum)
 It appears that you may have an inflamed lymph node in your abdomen.  Along with your symptoms, this could indicate that you have a stomach bug causing your symptoms.  I have included your CT scan results below.  Please make your PCP aware of these findings and schedule up a follow-up appointment with your PCP within the next week for recheck of your symptoms.  Constipation can also be causing some of your abdominal pain.  As discussed, please take 1 scoop of MiraLAX daily to help you maintain normal bowel movements. You may discontinue this medication once you return to having regular bowel movements. You were given your first dose here today.  This is an over-the-counter medication you can obtain at any drugstore.   You have been prescribed Zofran to use at home to help with your nausea and vomiting.  You may take this up to every 8 hours as needed.  You have been prescribed bentyl to help with your abdominal cramping. Please take this as prescribed.   You have been prescribed prednisone which is an anti-inflammatory medication. Please take this medication as prescribed for the next 7 days (40mg  on days 1 and 2, 30mg  on days 3 and 4, 20mg  on days 5 and 6, 10mg  on day 7). You were given your first dose here today. Take this medication in the morning with breakfast, as taking it at night may make it hard to sleep.   Your kidney, liver, and pancreas labs are normal today.  Your urine did not show any signs of infection.  Your urine shows that you are dehydrated.  Please increase your intake of water at home.   Return to the ER for uncontrolled vomiting, fevers, if you are no longer having bowel movements or passing gas, any other new or concerning symptoms.   "EXAM: CT ABDOMEN AND PELVIS WITH CONTRAST   TECHNIQUE: Multidetector CT imaging of the abdomen and pelvis was performed using the standard protocol following bolus administration of intravenous contrast.   RADIATION DOSE REDUCTION: This  exam was performed according to the departmental dose-optimization program which includes automated exposure control, adjustment of the mA and/or kV according to patient size and/or use of iterative reconstruction technique.   CONTRAST:  OMNIPAQUE IOHEXOL 300 MG/ML  SOLN   COMPARISON:  Six twenty twelve.   FINDINGS: Lower chest: Mild lung base opacities consistent with atelectasis.   Hepatobiliary: Normal liver. Stone or stones in the gallbladder fundus. No gallbladder wall thickening or adjacent inflammation. No bile duct dilation.   Pancreas: Unremarkable. No pancreatic ductal dilatation or surrounding inflammatory changes.   Spleen: Normal in size without focal abnormality.   Adrenals/Urinary Tract: Adrenal glands are unremarkable. Kidneys are normal, without renal calculi, focal lesion, or hydronephrosis. Bladder is unremarkable.   Stomach/Bowel: Stomach unremarkable. Small bowel and colon are normal in caliber. No wall thickening. No inflammation. Normal appendix.   Small irregular low-attenuation lesion/mass in the central small bowel mesentery measuring three point zero by one point zero by one point nine cm. Not evident on the prior CT. This is nonspecific.   Vascular/Lymphatic: No significant vascular findings are present. No enlarged abdominal or pelvic lymph nodes.   Reproductive: Unremarkable.   Other: No ascites.   Musculoskeletal: No acute or significant osseous findings.   IMPRESSION: 1. Small low-attenuation mass/lesion in the central small bowel mesentery. This is of unclear etiology and chronicity, but was not evident on the 2012 CT. It could potentially reflect a enlarged mesenteric lymph node  or small chronic inflammatory collection. 2. No convincing acute abnormality within the abdomen or pelvis. No bowel inflammation. 3. Gallstone/stones in the gallbladder fundus. No acute cholecystitis.     Electronically Signed   By: Amie Portland  M.D.   On: 08/22/2023 16:57"

## 2023-08-22 NOTE — ED Triage Notes (Signed)
 Pt states that for the last two days he's been having diffuse abd pain radiating to his back bilaterally. Pt denies N/V/D, but states that he is having more frequent stools which rotate from loose to feeling constipated. Endorses some urinary changes with intermittent feeling of needing to urinate but inability to do so.

## 2023-08-22 NOTE — ED Provider Notes (Addendum)
 [place] EMERGENCY DEPARTMENT AT [facility] Provider Note   CSN: [id number] Arrival date & time: 08/22/23  1422     History  Chief Complaint  Patient presents with   Abdominal Pain    Juan Woodward is a 33 y.o. male with no significant past medical history presents with concern for diffuse abdominal pain that radiates around to both sides of his back for the past 2 days. This has progressively been getting worse. He denies any nausea, vomiting, or diarrhea.  Reports he also feels constipated, but is having regular bowel movements.  He denies any fevers or chills, recent travel, recent antibiotic use.  He did report eating [employer] at [employer] the other day, and is unsure if this may be related to his symptoms.   Abdominal Pain      Home Medications Prior to Admission medications   Medication Sig Start Date End Date Taking? Authorizing Provider  dicyclomine (BENTYL) 20 MG tablet Take 1 tablet (20 mg total) by mouth 2 (two) times daily as needed for spasms (Abdominal pain/cramping). 08/22/23  Yes Arabella Merles, PA-C  ondansetron (ZOFRAN-ODT) 4 MG disintegrating tablet Take 1 tablet (4 mg total) by mouth every 8 (eight) hours as needed for nausea or vomiting. 08/22/23  Yes Arabella Merles, PA-C  predniSONE (DELTASONE) 10 MG tablet Take 4 tablets (40 mg total) by mouth daily with breakfast for 1 day, THEN 3 tablets (30 mg total) daily with breakfast for 2 days, THEN 2 tablets (20 mg total) daily with breakfast for 2 days, THEN 1 tablet (10 mg total) daily with breakfast for 1 day. 08/23/23 08/29/23 Yes Arabella Merles, PA-C  albuterol (VENTOLIN HFA) 108 (90 Base) MCG/ACT inhaler Inhale 2 puffs into the lungs every 6 (six) hours as needed. 08/12/23   Leath-Warren, Sadie Haber, NP  amoxicillin-clavulanate (AUGMENTIN) 875-125 MG tablet Take 1 tablet by mouth every 12 (twelve) hours. 08/12/23   Leath-Warren, Sadie Haber, NP      Allergies    Ibuprofen    Review of  Systems   Review of Systems  Gastrointestinal:  Positive for abdominal pain.    Physical Exam Updated Vital Signs BP (!) 138/90   Pulse (!) 59   Temp 98 F (36.7 C)   Resp 16   SpO2 95%  Physical Exam Vitals and nursing note reviewed.  Constitutional:      General: He is not in acute distress.    Appearance: He is well-developed.     Comments: No active vomiting  HENT:     Head: Normocephalic and atraumatic.  Eyes:     Conjunctiva/sclera: Conjunctivae normal.  Cardiovascular:     Rate and Rhythm: Normal rate and regular rhythm.     Heart sounds: No murmur heard. Pulmonary:     Effort: Pulmonary effort is normal. No respiratory distress.     Breath sounds: Normal breath sounds.  Abdominal:     Palpations: Abdomen is soft.     Tenderness: There is no abdominal tenderness.     Comments: Mild abdominal tenderness to palpation.  No rebound or guarding  Musculoskeletal:        General: No swelling.     Cervical back: Neck supple.  Skin:    General: Skin is warm and dry.     Capillary Refill: Capillary refill takes less than 2 seconds.  Neurological:     Mental Status: He is alert.  Psychiatric:        Mood and Affect: Mood normal.  ED Results / Procedures / Treatments   Labs (all labs ordered are listed, but only abnormal results are displayed) Labs Reviewed  COMPREHENSIVE METABOLIC PANEL - Abnormal; Notable for the following components:      Result Value   Glucose, Bld 100 (*)    All other components within normal limits  CBC - Abnormal; Notable for the following components:   WBC 12.4 (*)    All other components within normal limits  URINALYSIS, ROUTINE W REFLEX MICROSCOPIC - Abnormal; Notable for the following components:   Color, Urine STRAW (*)    Specific Gravity, Urine >1.046 (*)    All other components within normal limits  LIPASE, BLOOD    EKG None  Radiology CT ABDOMEN PELVIS W CONTRAST Result Date: 08/22/2023 CLINICAL DATA:  Diffuse  abdominal pain radiating to the back. EXAM: CT ABDOMEN AND PELVIS WITH CONTRAST TECHNIQUE: Multidetector CT imaging of the abdomen and pelvis was performed using the standard protocol following bolus administration of intravenous contrast. RADIATION DOSE REDUCTION: This exam was performed according to the departmental dose-optimization program which includes automated exposure control, adjustment of the mA and/or kV according to patient size and/or use of iterative reconstruction technique. CONTRAST:  OMNIPAQUE IOHEXOL 300 MG/ML  SOLN COMPARISON:  Six twenty twelve. FINDINGS: Lower chest: Mild lung base opacities consistent with atelectasis. Hepatobiliary: Normal liver. Stone or stones in the gallbladder fundus. No gallbladder wall thickening or adjacent inflammation. No bile duct dilation. Pancreas: Unremarkable. No pancreatic ductal dilatation or surrounding inflammatory changes. Spleen: Normal in size without focal abnormality. Adrenals/Urinary Tract: Adrenal glands are unremarkable. Kidneys are normal, without renal calculi, focal lesion, or hydronephrosis. Bladder is unremarkable. Stomach/Bowel: Stomach unremarkable. Small bowel and colon are normal in caliber. No wall thickening. No inflammation. Normal appendix. Small irregular low-attenuation lesion/mass in the central small bowel mesentery measuring three point zero by one point zero by one point nine cm. Not evident on the prior CT. This is nonspecific. Vascular/Lymphatic: No significant vascular findings are present. No enlarged abdominal or pelvic lymph nodes. Reproductive: Unremarkable. Other: No ascites. Musculoskeletal: No acute or significant osseous findings. IMPRESSION: 1. Small low-attenuation mass/lesion in the central small bowel mesentery. This is of unclear etiology and chronicity, but was not evident on the 2012 CT. It could potentially reflect a enlarged mesenteric lymph node or small chronic inflammatory collection. 2. No convincing  acute abnormality within the abdomen or pelvis. No bowel inflammation. 3. Gallstone/stones in the gallbladder fundus. No acute cholecystitis. Electronically Signed   By: Amie Portland M.D.   On: 08/22/2023 16:57    Procedures Procedures    Medications Ordered in ED Medications  polyethylene glycol (MIRALAX / GLYCOLAX) packet 17 g (17 g Oral Given 08/22/23 1831)  fentaNYL (SUBLIMAZE) injection 50 mcg (50 mcg Intravenous Given 08/22/23 1550)  ondansetron (ZOFRAN) injection 4 mg (4 mg Intravenous Given 08/22/23 1551)  iohexol (OMNIPAQUE) 300 MG/ML solution 100 mL (100 mLs Intravenous Contrast Given 08/22/23 1629)  dicyclomine (BENTYL) capsule 10 mg (10 mg Oral Given 08/22/23 1737)  predniSONE (DELTASONE) tablet 40 mg (40 mg Oral Given 08/22/23 1831)  prochlorperazine (COMPAZINE) injection 10 mg (10 mg Intravenous Given 08/22/23 1831)    ED Course/ Medical Decision Making/ A&P                                 Medical Decision Making Amount and/or Complexity of Data Reviewed Labs: ordered. Radiology: ordered.  Risk OTC drugs.  Prescription drug management.     Differential diagnosis includes but is not limited to Cholelithiasis, cholangitis, choledocholithiasis, peptic ulcer, gastritis, gastroenteritis, appendicitis, IBS, IBD, DKA, nephrolithiasis, UTI, pyelonephritis, pancreatitis, diverticulitis, mesenteric ischemia, abdominal aortic aneurysm, small bowel obstruction, volvulus, testicular torsion in males   ED Course:  Upon initial evaluation, patient appears somewhat uncomfortable, curled in the bed.  Stable vital signs aside from a slightly elevated blood pressure at 138/102.  Has abdomen is mildly tender to palpation, but no rebound or guarding. Patient initially given fentanyl, Zofran, and bentyl for abdominal pain and nausea I Ordered, and personally interpreted labs.  The pertinent results include:   CMP without any elevation LFTs or creatinine.  Normal electrolytes CBC with  leukocytosis of 12.4 Lipase without abnormality Urinalysis with high specific gravity, no nitrates or leukocytes to suggest infection Upon re-evaluation, patient reports some improvement in his symptoms.  CT abdomen and pelvis was obtained which showed a small low-attenuation mass/lesion in the central small bowel mesentery, thought to be possible enlarged lymph node or small chronic inflammatory collection.  No evidence of acute intra-abdominal abnormality.  No evidence of UTI or pyelonephritis.  No evidence of acute intra-abdominal pathology such as appendicitis, small bowel obstruction, or hepatobiliary process on CT.  Discussed that since his CT shows possible enlarged lymph node and CBC with leukocytosis, he likely has some inflammation from a gastroenteritis.  This seems likely secondary to the [employer] food he ate. Discussed that we can try a course of predisone at home for the inflammation seen on CT which he would like to try. Will also try continuing bentyl and zofran at home for symptoms.  He also feels like he is not having complete bowel movements.  Given a dose of MiraLAX here today and encouraged to use MiraLAX at home if he is not having regular bowel movements.  Patient stable appropriate for discharge home  Impression: Abdominal pain, likely gastroenteritis  Disposition:  The patient was discharged home with instructions to take prednisone taper as prescribed.  Bentyl and Zofran as prescribed.  Keep well-hydrated at home with water.  Follow-up with PCP within the next week for recheck of symptoms and to notify them of CT scan results. Return precautions given.  Imaging Studies ordered: I ordered imaging studies including CT abdomen pelvis I independently visualized the imaging with scope of interpretation limited to determining acute life threatening conditions related to emergency care. Imaging showed small low-attenuation mass/lesion in the central small bowel  mesentery.  Unclear etiology and chronicity.  Potential enlarged mesenteric lymph node or small chronic inflammatory collection.  No convincing acute abnormality.  Gallstones in the gallbladder fundus, no acute cholecystitis I agree with the radiologist interpretation   Cardiac Monitoring: / EKG: The patient was maintained on a cardiac monitor.  I personally viewed and interpreted the cardiac monitored which showed an underlying rhythm of: Normal sinus rhythm               Final Clinical Impression(s) / ED Diagnoses Final diagnoses:  Generalized abdominal pain    Rx / DC Orders ED Discharge Orders          Ordered    predniSONE (DELTASONE) 10 MG tablet  Q breakfast        08/22/23 1838    dicyclomine (BENTYL) 20 MG tablet  2 times daily PRN        08/22/23 1838    ondansetron (ZOFRAN-ODT) 4 MG disintegrating tablet  Every 8 hours PRN  08/22/23 1838              Arabella Merles, PA-C 08/22/23 1915    Arabella Merles, PA-C 08/22/23 1915    Loetta Rough, MD 08/22/23 [phone]

## 2023-09-12 ENCOUNTER — Other Ambulatory Visit: Payer: Self-pay

## 2023-09-12 ENCOUNTER — Ambulatory Visit
Admission: EM | Admit: 2023-09-12 | Discharge: 2023-09-12 | Disposition: A | Discharge status: Home / Self Care | Payer: Self-pay | Attending: Family Medicine | Admitting: Family Medicine

## 2023-09-12 ENCOUNTER — Encounter: Payer: Self-pay | Admitting: Emergency Medicine

## 2023-09-12 DIAGNOSIS — H6123 Impacted cerumen, bilateral: Secondary | ICD-10-CM

## 2023-09-12 NOTE — ED Provider Notes (Signed)
 [facility]    CSN: [id number] Arrival date & time: 09/12/23  0803      History   Chief Complaint Chief Complaint  Patient presents with   Ear Fullness    HPI Juan Woodward is a 33 y.o. male.   Patient presenting today with bilateral ear fullness, muffled hearing for a week or so now.  States he thinks there is something plugging them up and he has tried peroxide rinses at home with no relief.  Denies bleeding, drainage, headache, congestion, known foreign body.  Does note that he wears earplugs at work.    Past Medical History:  Diagnosis Date   Allergy    Chronic sinusitis    Frequent nosebleeds    Headache    Nasal polyp    Scoliosis     Patient Active Problem List   Diagnosis Date Noted   Nasal polyp 07/20/2016   Cerumen impaction 07/20/2016   Healthcare maintenance 07/20/2016   Elevated blood pressure reading 07/20/2016    Past Surgical History:  Procedure Laterality Date   ETHMOIDECTOMY Bilateral 05/22/2019   Procedure: TOTAL ETHMOIDECTOMY WITH TISSUE REMOVAL;  Surgeon: Newman Pies, MD;  Location: [facility];  Service: ENT;  Laterality: Bilateral;   FRONTAL SINUS EXPLORATION Bilateral 05/22/2019   Procedure: ENDOSCOPIC FRONTAL RECESS EXPLORATION;  Surgeon: Newman Pies, MD;  Location: [facility];  Service: ENT;  Laterality: Bilateral;   MAXILLARY ANTROSTOMY Bilateral 05/22/2019   Procedure: ENDOSCOPIC MAXILLARY ANTROSTOMY WITH TISSUE REMOVAL;  Surgeon: Newman Pies, MD;  Location: [facility];  Service: ENT;  Laterality: Bilateral;   NASAL SEPTOPLASTY W/ TURBINOPLASTY Bilateral 05/22/2019   Procedure: NASAL SEPTOPLASTY WITH BILATERAL TURBINATE REDUCTION;  Surgeon: Newman Pies, MD;  Location: [facility];  Service: ENT;  Laterality: Bilateral;   SINUS ENDO WITH FUSION Bilateral 05/22/2019   Procedure: SINUS ENDOSCOPY WITH FUSION NAVIGATION;  Surgeon: Newman Pies, MD;  Location: [facility];  Service: ENT;  Laterality: Bilateral;   SPHENOIDECTOMY Bilateral 05/22/2019   Procedure: SPHENOIDECTOMY WITH TISSUE REMOVAL;  Surgeon: Newman Pies, MD;  Location: [facility];  Service: ENT;  Laterality: Bilateral;       Home Medications    Prior to Admission medications   Medication Sig Start Date End Date Taking? Authorizing Provider  albuterol (VENTOLIN HFA) 108 (90 Base) MCG/ACT inhaler Inhale 2 puffs into the lungs every 6 (six) hours as needed. 08/12/23   Leath-Warren, Sadie Haber, NP  amoxicillin-clavulanate (AUGMENTIN) 875-125 MG tablet Take 1 tablet by mouth every 12 (twelve) hours. 08/12/23   Leath-Warren, Sadie Haber, NP  dicyclomine (BENTYL) 20 MG tablet Take 1 tablet (20 mg total) by mouth 2 (two) times daily as needed for spasms (Abdominal pain/cramping). 08/22/23   Arabella Merles, PA-C  ondansetron (ZOFRAN-ODT) 4 MG disintegrating tablet Take 1 tablet (4 mg total) by mouth every 8 (eight) hours as needed for nausea or vomiting. 08/22/23   Arabella Merles, PA-C    Family History Family History  Problem Relation Age of Onset   Hypertension Mother    Diabetes Maternal Grandmother    Heart disease Neg Hx     Social History Social History   Tobacco Use   Smoking status: Former    Current packs/day: 0.00    Types: Cigarettes    Quit date: 11/07/2015    Years since quitting: 7.8   Smokeless tobacco: Never  Vaping Use   Vaping status: Never Used  Substance Use Topics  Alcohol use: Not Currently    Alcohol/week: 6.0 standard drinks of alcohol    Types: 6 Cans of beer per week    Comment: 10-12 cans of beer on the weekend every 2-3 weeks   Drug use: No     Allergies   Ibuprofen   Review of Systems Review of Systems Per HPI  Physical Exam Triage Vital Signs ED Triage Vitals  Encounter Vitals Group     BP 09/12/23 0816 126/85     Systolic BP Percentile --      Diastolic BP Percentile --      Pulse Rate 09/12/23 0816 77     Resp  09/12/23 0816 18     Temp 09/12/23 0816 97.9 F (36.6 C)     Temp Source 09/12/23 0816 Oral     SpO2 09/12/23 0816 96 %     Weight --      Height --      Head Circumference --      Peak Flow --      Pain Score 09/12/23 0817 0     Pain Loc --      Pain Education --      Exclude from Growth Chart --    No data found.  Updated Vital Signs BP 126/85 (BP Location: Right Arm)   Pulse 77   Temp 97.9 F (36.6 C) (Oral)   Resp 18   SpO2 96%   Visual Acuity Right Eye Distance:   Left Eye Distance:   Bilateral Distance:    Right Eye Near:   Left Eye Near:    Bilateral Near:     Physical Exam Vitals and nursing note reviewed.  Constitutional:      Appearance: Normal appearance.  HENT:     Head: Atraumatic.     Right Ear: There is impacted cerumen.     Left Ear: There is impacted cerumen.     Mouth/Throat:     Mouth: Mucous membranes are moist.  Eyes:     Extraocular Movements: Extraocular movements intact.     Conjunctiva/sclera: Conjunctivae normal.  Cardiovascular:     Rate and Rhythm: Normal rate.  Pulmonary:     Effort: Pulmonary effort is normal.  Musculoskeletal:        General: Normal range of motion.     Cervical back: Normal range of motion and neck supple.  Skin:    General: Skin is warm and dry.  Neurological:     Mental Status: He is oriented to person, place, and time.  Psychiatric:        Mood and Affect: Mood normal.        Thought Content: Thought content normal.        Judgment: Judgment normal.      UC Treatments / Results  Labs (all labs ordered are listed, but only abnormal results are displayed) Labs Reviewed - No data to display  EKG   Radiology No results found.  Procedures Procedures (including critical care time)  Medications Ordered in UC Medications - No data to display  Initial Impression / Assessment and Plan / UC Course  I have reviewed the triage vital signs and the nursing notes.  Pertinent labs & imaging results  that were available during my care of the patient were reviewed by me and considered in my medical decision making (see chart for details).     Lavage performed today to bilateral ears with warm water and peroxide with clearance of wax plugs bilaterally.  TMs visualized and benign post procedure.  Patient tolerated procedure well.  Return for any worsening symptoms.  Final Clinical Impressions(s) / UC Diagnoses   Final diagnoses:  Bilateral impacted cerumen   Discharge Instructions   None    ED Prescriptions   None    PDMP not reviewed this encounter.   Roosvelt Maser [place], [place] 09/12/23 [phone]

## 2023-09-12 NOTE — ED Triage Notes (Signed)
 Pt reports bilateral ear ringing and decreased hearing. Pt reports has tried peroxide and denies any change in symptoms.
